# Patient Record
Sex: Female | Born: 2000 | Race: White | Hispanic: No | Marital: Single | Smoking: Never smoker
Health system: Southern US, Community
[De-identification: ages and names within clinical notes are randomized; demographics above are authoritative.]

## PROBLEM LIST (undated history)

## (undated) DIAGNOSIS — J45909 Unspecified asthma, uncomplicated: Secondary | ICD-10-CM

## (undated) DIAGNOSIS — Z91018 Allergy to other foods: Secondary | ICD-10-CM

## (undated) DIAGNOSIS — J309 Allergic rhinitis, unspecified: Secondary | ICD-10-CM

## (undated) HISTORY — PX: WRIST SURGERY: SHX841

## (undated) HISTORY — DX: Unspecified asthma, uncomplicated: J45.909

## (undated) HISTORY — DX: Allergic rhinitis, unspecified: J30.9

## (undated) HISTORY — DX: Allergy to other foods: Z91.018

## (undated) HISTORY — PX: NO PAST SURGERIES: SHX2092

## (undated) HISTORY — PX: WISDOM TOOTH EXTRACTION: SHX21

---
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
## 2020-01-29 IMAGING — CR LEFT FOOT - COMPLETE 3+ VIEW
3 series · 3 of 3 positions shown · non-contrast
Comparison: None.

CLINICAL DATA: Initial evaluation for acute pain status post
softball injury.

EXAM:
LEFT FOOT - COMPLETE 3+ VIEW

[t foot ap left]
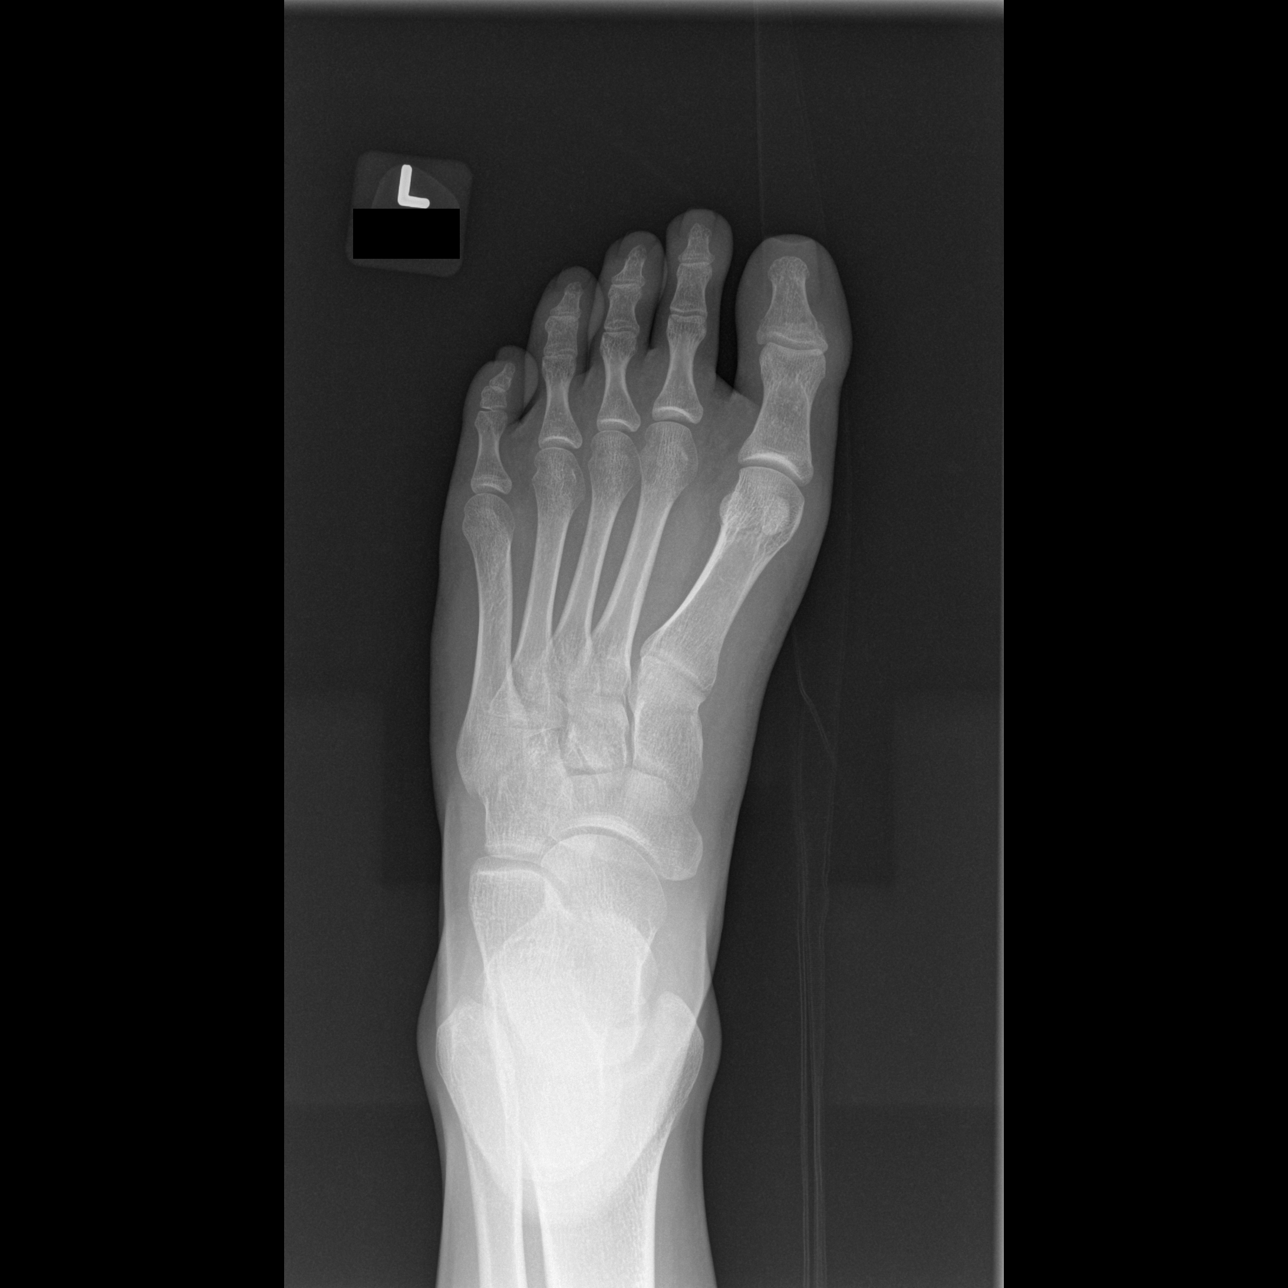

[t foot oblique left]
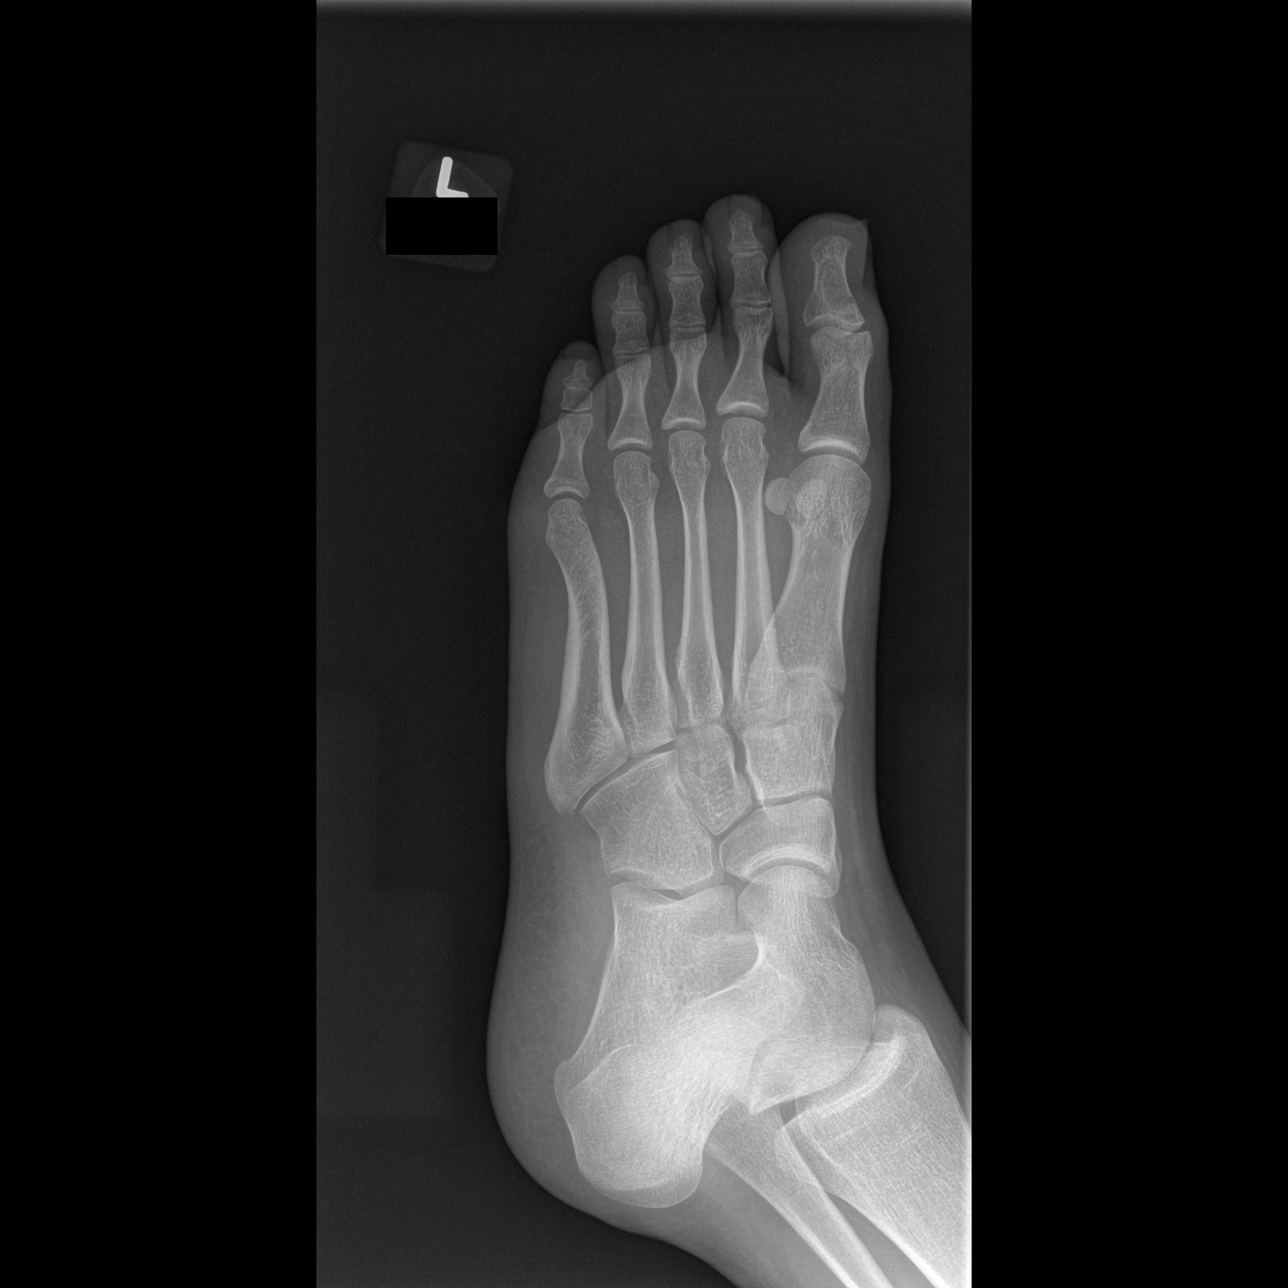

[t foot lat left]
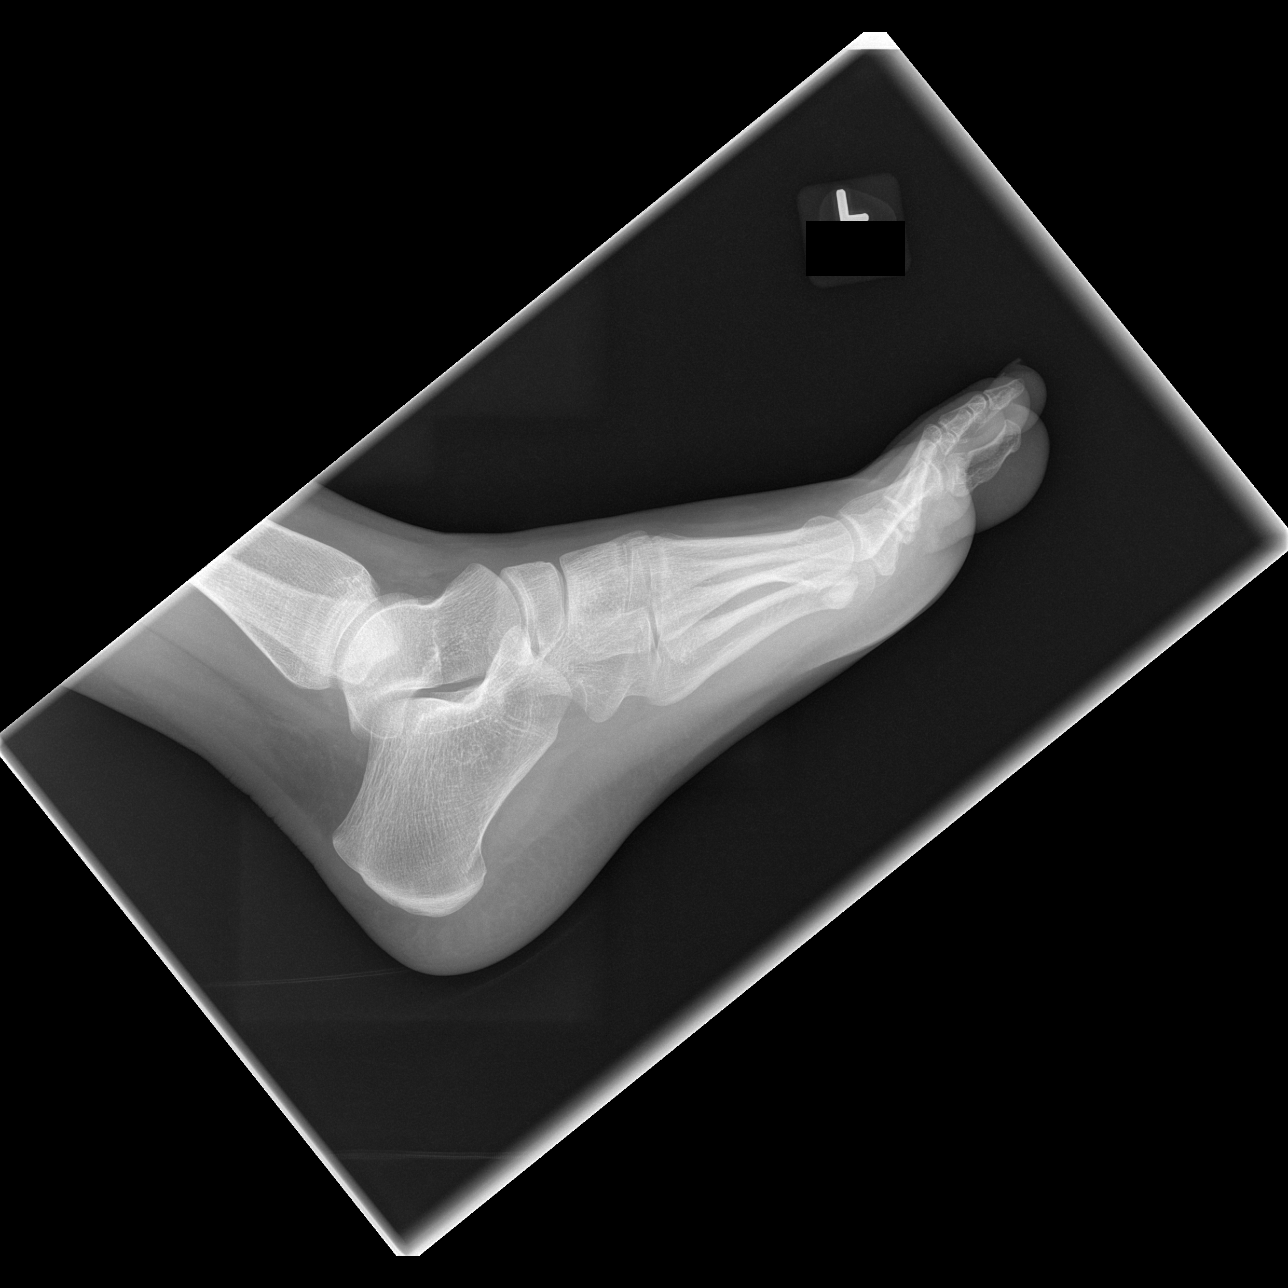

[3 of 3 positions shown; findings below may reference images not displayed]

FINDINGS: There is no evidence of fracture or dislocation. There is no
evidence of arthropathy or other focal bone abnormality. Soft
tissues are unremarkable.
IMPRESSION: No acute osseous abnormality about the left foot.

## 2020-10-17 ENCOUNTER — Ambulatory Visit
Admission: EM | Admit: 2020-10-17 | Discharge: 2020-10-17 | Disposition: A | Discharge status: Home / Self Care | Payer: BC Managed Care – PPO | Attending: Student | Admitting: Student

## 2020-10-17 ENCOUNTER — Other Ambulatory Visit: Payer: Self-pay

## 2020-10-17 DIAGNOSIS — N3001 Acute cystitis with hematuria: Secondary | ICD-10-CM | POA: Insufficient documentation

## 2020-10-17 LAB — POCT URINALYSIS DIP (MANUAL ENTRY)
Bilirubin, UA: NEGATIVE
Glucose, UA: NEGATIVE mg/dL
Ketones, POC UA: NEGATIVE mg/dL
Nitrite, UA: NEGATIVE
Protein Ur, POC: 30 mg/dL — AB
Spec Grav, UA: 1.02 (ref 1.010–1.025)
Urobilinogen, UA: 0.2 E.U./dL
pH, UA: 7 (ref 5.0–8.0)

## 2020-10-17 LAB — POCT URINE PREGNANCY: Preg Test, Ur: NEGATIVE

## 2020-10-17 MED ORDER — NITROFURANTOIN MONOHYD MACRO 100 MG PO CAPS: 100.0000 mg | ORAL_CAPSULE | Freq: Two times a day (BID) | ORAL | 0 refills | Status: DC

## 2020-10-17 NOTE — Discharge Instructions (Addendum)
For your UTI, start the antibiotic- macrobid twice daily for 5 days.   Make sure to drink plenty of water.   If you experience worsening of symptoms despite treatment (abdominal pain, back pain, nausea, fevers, etc)- seek additional, immediate medical attention.

## 2020-10-17 NOTE — ED Provider Notes (Signed)
[name]    CSN: [id number] Arrival date & time: 10/17/20  0843      History   Chief Complaint Chief Complaint  Patient presents with  . Urinary Tract Infection    HPI Molly Pace is a 20 y.o. female presenting with urinary urgency, frequency, suprapubic pressure x2 days. History asthma, allergic rhinitis, UTIs. Denies hematuria, back pain, n/v/d, fevers/chills, abdnormal vaginal discharge. Denies STI risk, declines screening today.   HPI  Past Medical History:  Diagnosis Date  . Allergic rhinitis   . Asthma   . Food allergy     Patient Active Problem List   Diagnosis Date Noted  . Mild intermittent asthma 10/01/2015  . Seasonal allergic rhinitis due to pollen 10/01/2015  . 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 Mild persistent asthma 10/01/2015    Past Surgical History:  Procedure Laterality Date  . NO PAST SURGERIES    . WISDOM TOOTH EXTRACTION    . WRIST SURGERY Right    ganglion cyst removal    OB History   No obstetric history on file.      Home Medications    Prior to Admission medications   Medication Sig Start Date End Date Taking? Authorizing Provider  nitrofurantoin, macrocrystal-monohydrate, (MACROBID) 100 MG capsule Take 1 capsule (100 mg total) by mouth 2 (two) times daily. 10/17/20  Yes Hazel Sams, PA-C  albuterol [facility] HFA) 108 (90 Base) MCG/ACT inhaler Inhale 2 puffs into the lungs every 4 (four) hours as needed for wheezing or shortness of breath. 08/12/18   Charlies Silvers, MD  EPINEPHrine (EPIPEN 2-PAK) 0.3 mg/0.3 mL IJ SOAJ injection USE AS DIRECTED FOR SEVERE ALLERGIC REACTION. HOLD RX.  PT WILL CALL FOR FILL 04/19/18   Charlies Silvers, MD  fexofenadine ([name]) 180 MG tablet Take 180 mg by mouth daily as needed for allergies or rhinitis.    [provider]  fluticasone (FLONASE) 50 MCG/ACT nasal spray Two sprays each nostril once a day for nasal congestion or drainage.  04/19/18   Charlies Silvers, MD  ibuprofen (ADVIL,MOTRIN) 400 MG tablet Take 400 mg by mouth every 6 (six) hours as needed for fever or moderate pain.     [provider]  montelukast (SINGULAIR) 10 MG tablet Take 1 tablet (10 mg total) by mouth at bedtime. 04/19/18   Charlies Silvers, MD  NUVARING 0.12-0.015 MG/24HR vaginal ring INSERT 1 VAGINAL RING MONTHLY FOR 21 DAYS 04/25/17   [provider]    Family History Family History  Problem Relation Age of Onset  . Allergic rhinitis Mother   . Allergic rhinitis Maternal Grandmother   . Breast cancer Maternal Grandmother 29  . Angioedema Neg Hx   . Asthma Neg Hx   . Atopy Neg Hx   . Eczema Neg Hx   . Immunodeficiency Neg Hx   . Urticaria Neg Hx     Social History Social History   Tobacco Use  . Smoking status: Never Smoker  . Smokeless tobacco: Never Used  Vaping Use  . Vaping Use: Never used  Substance Use Topics  . Alcohol use: No    Alcohol/week: 0.0 standard drinks  . Drug use: No     Allergies   Peanut oil, Peanut-containing drug products, and Penicillins   Review of Systems Review of Systems  Constitutional: Negative for appetite change, chills, diaphoresis and fever.  Respiratory: Negative for shortness of breath.   Cardiovascular: Negative for chest pain.  Gastrointestinal: Negative for abdominal pain, blood in stool, constipation, diarrhea, nausea and vomiting.  Genitourinary: Positive for dysuria, frequency and urgency. Negative for decreased urine volume, difficulty urinating, flank pain, genital sores, hematuria, menstrual problem, pelvic pain, vaginal bleeding, vaginal discharge and vaginal pain.  Musculoskeletal: Negative for back pain.  Neurological: Negative for dizziness, weakness and light-headedness.  All other systems reviewed and are negative.    Physical Exam Triage Vital Signs ED Triage Vitals  Enc Vitals Group     BP 10/17/20 0904 114/68     Pulse Rate 10/17/20 0904 74      Resp 10/17/20 0904 20     Temp 10/17/20 0904 98.4 F (36.9 C)     Temp Source 10/17/20 0904 Oral     SpO2 10/17/20 0904 98 %     Weight --      Height --      Head Circumference --      Peak Flow --      Pain Score 10/17/20 0919 2     Pain Loc --      Pain Edu? --      Excl. in [place]? --    No data found.  Updated Vital Signs BP 114/68 (BP Location: Left Arm)   Pulse 74   Temp 98.4 F (36.9 C) (Oral)   Resp 20   LMP 10/01/2020   SpO2 98%   Visual Acuity Right Eye Distance:   Left Eye Distance:   Bilateral Distance:    Right Eye Near:   Left Eye Near:    Bilateral Near:     Physical Exam Vitals reviewed.  Constitutional:      General: She is not in acute distress.    Appearance: Normal appearance. She is not ill-appearing.  HENT:     Head: Normocephalic and atraumatic.  Cardiovascular:     Rate and Rhythm: Normal rate and regular rhythm.     Heart sounds: Normal heart sounds.  Pulmonary:     Effort: Pulmonary effort is normal.     Breath sounds: Normal breath sounds. No wheezing, rhonchi or rales.  Abdominal:     General: Bowel sounds are normal. There is no distension.     Palpations: Abdomen is soft. There is no mass.     Tenderness: There is abdominal tenderness in the suprapubic area. There is no right CVA tenderness, left CVA tenderness, guarding or rebound.     Comments: Bowel sounds positive in all 4 quadrants. No tenderness to palpation. Negative Murphy Sign, Rovsing's sign, McBurney point tenderness.   Neurological:     General: No focal deficit present.     Mental Status: She is alert and oriented to person, place, and time.  Psychiatric:        Mood and Affect: Mood normal.        Behavior: Behavior normal.      UC Treatments / Results  Labs (all labs ordered are listed, but only abnormal results are displayed) Labs Reviewed  POCT URINALYSIS DIP (MANUAL ENTRY) - Abnormal; Notable for the following components:      Result Value   Clarity, UA  cloudy (*)    Blood, UA large (*)    Protein Ur, POC =30 (*)    Leukocytes, UA Large (3+) (*)    All other components within normal limits  URINE CULTURE  POCT URINE PREGNANCY    EKG   Radiology No results found.  Procedures Procedures (including critical care time)  Medications Ordered in  UC Medications - No data to display  Initial Impression / Assessment and Plan / UC Course  I have reviewed the triage vital signs and the nursing notes.  Pertinent labs & imaging results that were available during my care of the patient were reviewed by me and considered in my medical decision making (see chart for details).     This patient is a 20 year old female presenting with urinary symptoms. History of UTI in the past.   UA today with large blood, large leuk. Culture sent. Plan to treat for acute cystitis with Macrobid as below. Rec good hydration.   She denies STI symptoms, declines screening today.   Spent over 30 minutes obtaining H&P, performing physical, discussing results, treatment plan and plan for follow-up with patient. Patient agrees with plan.   This chart was dictated using voice recognition software, Dragon. Despite the best efforts of this provider to proofread and correct errors, errors may still occur which can change documentation meaning.  Final Clinical Impressions(s) / UC Diagnoses   Final diagnoses:  Acute cystitis with hematuria     Discharge Instructions     For your UTI, start the antibiotic- macrobid twice daily for 5 days.   Make sure to drink plenty of water.   If you experience worsening of symptoms despite treatment (abdominal pain, back pain, nausea, fevers, etc)- seek additional, immediate medical attention.   ED Prescriptions    Medication Sig Dispense Auth. Provider   nitrofurantoin, macrocrystal-monohydrate, (MACROBID) 100 MG capsule Take 1 capsule (100 mg total) by mouth 2 (two) times daily. 10 capsule Hazel Sams, PA-C      PDMP not reviewed this encounter.   Hazel Sams, PA-C 10/17/20 [phone]

## 2020-10-17 NOTE — ED Triage Notes (Signed)
Pt c/o burning on urination with urgency and frequency x2 days.

## 2020-10-19 LAB — URINE CULTURE: Culture: >=100000 — AB

## 2020-11-28 ENCOUNTER — Ambulatory Visit: Payer: BC Managed Care – PPO | Admitting: Internal Medicine

## 2020-11-30 ENCOUNTER — Other Ambulatory Visit: Payer: Self-pay

## 2020-11-30 ENCOUNTER — Ambulatory Visit
Admission: EM | Admit: 2020-11-30 | Discharge: 2020-11-30 | Disposition: A | Discharge status: Home / Self Care | Payer: BC Managed Care – PPO | Attending: Urgent Care | Admitting: Urgent Care

## 2020-11-30 DIAGNOSIS — R519 Headache, unspecified: Secondary | ICD-10-CM

## 2020-11-30 MED ORDER — TIZANIDINE HCL 4 MG PO TABS: 4.0000 mg | ORAL_TABLET | Freq: Every day | ORAL | 0 refills | Status: DC
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
## 2020-12-20 ENCOUNTER — Other Ambulatory Visit: Payer: Self-pay | Admitting: General Surgery

## 2020-12-30 HISTORY — PX: BREAST LUMPECTOMY: SHX2

## 2021-01-21 NOTE — Progress Notes (Signed)
Patient did not show for appointment.   

## 2021-01-22 ENCOUNTER — Other Ambulatory Visit: Payer: Self-pay

## 2021-01-22 ENCOUNTER — Encounter: Payer: BC Managed Care – PPO | Admitting: Family

## 2021-01-22 DIAGNOSIS — Z7689 Persons encountering health services in other specified circumstances: Secondary | ICD-10-CM

## 2021-02-03 NOTE — Progress Notes (Signed)
Subjective:    Molly Pace - 20 y.o. female MRN [id number]  Date of birth: [birth year]  HPI  Molly Pace is to establish care. Patient has a PMH significant for mild intermittent asthma, seasonal allergic rhinitis due to pollen, and anaphylactic shock due to adverse food reaction.   Current issues and/or concerns: 1. HIP PAIN: Duration: chronic reports since childhood, has never had a provider to examine Involved hip: bilateral, right worse than left  Mechanism of injury: none Location: diffuse Severity: 4-7/10 Quality: sharp, aching and stabbing Frequency: constant Radiation: no Alleviating factors: nothing  Status: worse Treatments attempted: APAP and ibuprofen   Relief with NSAIDs?: no Paresthesias / decreased sensation: no Swelling: no Redness:no  Comments: Reports her brother has Ehlers-Danlos syndrome.    ROS per HPI    Health Maintenance:  Health Maintenance Due  Topic Date Due  . Pneumococcal Vaccine 49-47 Years old (1 of 4 - PCV13) Never done  . HPV VACCINES (1 - 2-dose series) Never done  . COVID-19 Vaccine (1) Never done  . HIV Screening  Never done  . Hepatitis C Screening  Never done    Past Medical History: Patient Active Problem List   Diagnosis Date Noted  . Ganglion cyst of dorsum of right wrist 01/28/2018  . Mild intermittent asthma 10/01/2015  . Seasonal allergic rhinitis due to pollen 10/01/2015  . 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 Mild persistent asthma 10/01/2015    Social History   reports that she has never smoked. She has never used smokeless tobacco. She reports that she does not drink alcohol and does not use drugs.   Family History  family history includes Allergic rhinitis in her maternal grandmother and mother; Breast cancer (age of onset: 82) in her maternal grandmother; Ehlers-Danlos syndrome in her brother.   Medications: reviewed and updated   Objective:   Physical Exam BP  107/72 (BP Location: Left Arm, Patient Position: Sitting, Cuff Size: Normal)   Pulse 78   Temp 97.7 F (36.5 C)   Resp 15   Ht 5' 6.14" (1.68 m)   Wt 112 lb (50.8 kg)   SpO2 99%   BMI 18.00 kg/m  Physical Exam HENT:     Head: Normocephalic and atraumatic.  Eyes:     Extraocular Movements: Extraocular movements intact.     Conjunctiva/sclera: Conjunctivae normal.     Pupils: Pupils are equal, round, and reactive to light.  Cardiovascular:     Rate and Rhythm: Normal rate and regular rhythm.     Pulses: Normal pulses.     Heart sounds: Normal heart sounds.  Pulmonary:     Effort: Pulmonary effort is normal.     Breath sounds: Normal breath sounds.  Musculoskeletal:     Cervical back: Normal range of motion and neck supple.     Right hip: Bony tenderness present.     Left hip: Bony tenderness and crepitus present. No tenderness.  Neurological:     General: No focal deficit present.     Mental Status: She is alert and oriented to person, place, and time.  Psychiatric:        Mood and Affect: Mood normal.        Behavior: Behavior normal.       Assessment & Plan:  1. Encounter to establish care: - Patient presents today to establish care.  - Return for annual physical examination, labs, and health maintenance. Arrive fasting meaning having no food for at  least 8 hours prior to appointment. You may have only water or black coffee. Please take scheduled medications as normal.  2. Chronic hip pain, bilateral: - Diagnostic x-ray bilateral hips for further evaluation.  - DG Hip Unilat W OR W/O Pelvis 2-3 Views Left; Future - DG Hip Unilat W OR W/O Pelvis 2-3 Views Right; Future    Patient was given clear instructions to go to Emergency Department or return to medical center if symptoms don't improve, worsen, or new problems develop.The patient verbalized understanding.  I discussed the assessment and treatment plan with the patient. The patient was provided an opportunity to  ask questions and all were answered. The patient agreed with the plan and demonstrated an understanding of the instructions.   The patient was advised to call back or seek an in-person evaluation if the symptoms worsen or if the condition fails to improve as anticipated.    Durene Fruits, NP 02/04/2021, 1:08 PM Primary Care at [facility]

## 2021-02-04 ENCOUNTER — Encounter: Payer: Self-pay | Admitting: Family

## 2021-02-04 ENCOUNTER — Ambulatory Visit (INDEPENDENT_AMBULATORY_CARE_PROVIDER_SITE_OTHER): Payer: BC Managed Care – PPO | Admitting: Family

## 2021-02-04 ENCOUNTER — Ambulatory Visit (INDEPENDENT_AMBULATORY_CARE_PROVIDER_SITE_OTHER): Payer: BC Managed Care – PPO

## 2021-02-04 ENCOUNTER — Other Ambulatory Visit: Payer: Self-pay

## 2021-02-04 VITALS — BP 107/72 | HR 78 | Temp 97.7°F | Resp 15 | Ht 66.14 in | Wt 112.0 lb

## 2021-02-04 DIAGNOSIS — Z13228 Encounter for screening for other metabolic disorders: Secondary | ICD-10-CM

## 2021-02-04 DIAGNOSIS — M25552 Pain in left hip: Secondary | ICD-10-CM | POA: Diagnosis not present

## 2021-02-04 DIAGNOSIS — M25551 Pain in right hip: Secondary | ICD-10-CM | POA: Diagnosis not present

## 2021-02-04 DIAGNOSIS — Z130 Encounter for screening for diseases of the blood and blood-forming organs and certain disorders involving the immune mechanism: Secondary | ICD-10-CM

## 2021-02-04 DIAGNOSIS — Z114 Encounter for screening for human immunodeficiency virus [HIV]: Secondary | ICD-10-CM

## 2021-02-04 DIAGNOSIS — G8929 Other chronic pain: Secondary | ICD-10-CM

## 2021-02-04 DIAGNOSIS — Z1329 Encounter for screening for other suspected endocrine disorder: Secondary | ICD-10-CM

## 2021-02-04 DIAGNOSIS — Z1159 Encounter for screening for other viral diseases: Secondary | ICD-10-CM

## 2021-02-04 DIAGNOSIS — Z13220 Encounter for screening for lipoid disorders: Secondary | ICD-10-CM

## 2021-02-04 DIAGNOSIS — Z7689 Persons encountering health services in other specified circumstances: Secondary | ICD-10-CM

## 2021-02-04 DIAGNOSIS — Z131 Encounter for screening for diabetes mellitus: Secondary | ICD-10-CM

## 2021-02-04 NOTE — Progress Notes (Signed)
Establish care Bilateral hip pain and popping when walking more pain in right hip

## 2021-02-04 NOTE — Patient Instructions (Signed)
- Return for annual physical examination, labs, and health maintenance. Arrive fasting meaning having no food for at least 8 hours prior to appointment. You may have only water or black coffee. Please take scheduled medications as normal. Thank you for choosing Primary Care at [facility] for your medical home!    Molly Pace was seen by Camillia Herter, NP today.   Molly Pace's primary care provider is Durene Fruits, NP.   For the best care possible,  you should try to see Durene Fruits, NP whenever you come to clinic.   We look forward to seeing you again soon!  If you have any questions about your visit today,  please call us at [phone]  Or feel free to reach your provider via [place].    Keeping you healthy   Get these tests  Blood pressure- Have your blood pressure checked once a year by your healthcare provider.  Normal blood pressure is 120/80.  Weight- Have your body mass index (BMI) calculated to screen for obesity.  BMI is a measure of body fat based on height and weight. You can also calculate your own BMI at [employer].it.  Cholesterol- Have your cholesterol checked regularly starting at age 52, sooner may be necessary if you have diabetes, high blood pressure, if a family member developed heart diseases at an early age or if you smoke.   Chlamydia, HIV, and other sexual transmitted disease- Get screened each year until the age of 65 then within three months of each new sexual partner.  Diabetes- Have your blood sugar checked regularly if you have high blood pressure, high cholesterol, a family history of diabetes or if you are overweight.   Get these vaccines  Flu shot- Every fall.  Tetanus shot- Every 10 years.  Menactra- Single dose; prevents meningitis.   Take these steps  Don't smoke- If you do smoke, ask your healthcare provider about quitting. For tips on how to quit, go to www.smokefree.gov or call  1-800-QUIT-NOW.  Be physically active- Exercise 5 days a week for at least 30 minutes.  If you are not already physically active start slow and gradually work up to 30 minutes of moderate physical activity.  Examples of moderate activity include walking briskly, mowing the yard, dancing, swimming bicycling, etc.  Eat a healthy diet- Eat a variety of healthy foods such as fruits, vegetables, low fat milk, low fat cheese, yogurt, lean meats, poultry, fish, beans, tofu, etc.  For more information on healthy eating, go to www.thenutritionsource.org  Drink alcohol in moderation- Limit alcohol intake two drinks or less a day.  Never drink and drive.  Dentist- Brush and floss teeth twice daily; visit your dentis twice a year.  Depression-Your emotional health is as important as your physical health.  If you're feeling down, losing interest in things you normally enjoy please talk with your healthcare provider.  Gun Safety- If you keep a gun in your home, keep it unloaded and with the safety lock on.  Bullets should be stored separately.  Helmet use- Always wear a helmet when riding a motorcycle, bicycle, rollerblading or skateboarding.  Safe sex- If you may be exposed to a sexually transmitted infection, use a condom  Seat belts- Seat bels can save your life; always wear one.  Smoke/Carbon Monoxide detectors- These detectors need to be installed on the appropriate level of your home.  Replace batteries at least once a year.  Skin Cancer- When out in the sun, cover up and  use sunscreen SPF 15 or higher.  Violence- If anyone is threatening or hurting you, please tell your healthcare provider.

## 2021-02-05 ENCOUNTER — Other Ambulatory Visit: Payer: Self-pay | Admitting: Family

## 2021-02-05 DIAGNOSIS — G8929 Other chronic pain: Secondary | ICD-10-CM

## 2021-02-05 NOTE — Progress Notes (Signed)
Bilateral hip x-rays normal. Referral to Physical Therapy for chronic pain management. Their office should call patient within 2 weeks with appointment details.

## 2021-02-18 ENCOUNTER — Ambulatory Visit: Payer: BC Managed Care – PPO | Attending: Family | Admitting: Rehabilitative and Restorative Service Providers"

## 2021-04-23 NOTE — Progress Notes (Signed)
New Patient Note  RE: Molly Pace MRN: [id number] DOB: 02-21-2001 Date of Office Visit: 04/24/2021  Consult requested by: Camillia Herter, NP Primary care provider: Camillia Herter, NP  Chief Complaint: Allergy Testing  History of Present Illness: I had the pleasure of seeing Molly Pace for initial evaluation at the [facility] on 04/24/2021. She is a 20 y.o. female, who is self-referred here for the re-evaluation of food allergies, asthma and allergic rhinitis.  Patient was last seen by Dr. Shaune Leeks on 04/19/2018 for asthma, food allergies and allergic rhinitis.  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  Peanuts: The reaction occurred at the age of 5, after she ate touched peanuts and then touched her eye which then swelled up. Patient is not sure of specific events - she may have gone to the ER. This was the first peanut exposure.  Patient had an accidental ingestion to peanuts with some Doritos about 1 month ago. Within minutes she had some itchy ears/throat, abdominal pain, vomiting. She took benadryl and within 10 minutes she felt much better.   She does have access to epinephrine autoinjector and not needed to use it.   Past work up includes: 2017 skin testing was positive to peanut, cashew, pecan, walnut and almond. Patient tolerates almonds and hazelnuts.  Dietary History: patient has been eating other foods including milk, eggs, treenuts - okay with almonds and hazelnuts, sesame, shellfish, fish, soy, wheat, meats, fruits and vegetables.  Asthma: She reports symptoms of chest tightness, shortness of breath, wheezing for 8 years. Current medications include albuterol prn which help. She reports not using aerochamber with inhalers. She tried the following inhalers: none. Main triggers are exercise. In the last month, frequency of symptoms: <1x/week. Frequency of nocturnal symptoms: 0x/month. Frequency of SABA use: <1x/week.  Interference with physical activity: yes. Sleep is undisturbed. In the last 12 months, emergency room visits/urgent care visits/doctor office visits or hospitalizations due to respiratory issues: no. In the last 12 months, oral steroids courses: no. Lifetime history of hospitalization for respiratory issues: no. Prior intubations: no. History of pneumonia: no. She was evaluated by allergist in the past. Smoking exposure: no. Up to date with flu vaccine: yes. Up to date with COVID-19 vaccine: yes. Prior Covid-19 infection: no. History of reflux: no.  Rhinitis: She reports symptoms of itchy/watery eyes, rhinorrhea, sneezing, nasal congestion, PND. Symptoms have been going on for 15+ years. The symptoms are present mainly in the spring and some in the summer. Other triggers include exposure to cats. Anosmia: no. Headache: no. She has used allegra, zyrtec, Flonase with fair improvement in symptoms. Sinus infections: 4 times per year which require antibiotics. Previous work up includes: 2007 skin testing positive to trees, dust mites, cat. No prior AIT. Previous ENT evaluation: no. Previous sinus imaging: no. History of nasal polyps: no. Last eye exam: last year.  Assessment and Plan: Molly Pace is a 20 y.o. female with: Anaphylactic reaction due to other food products, subsequent encounter Periorbital swelling at the age of 5 after peanut contact.  More recently had peanut ingestion which caused itchy ears and throat with abdominal pain and vomiting.  Took Benadryl and symptoms resolved within 10 minutes.  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  Currently tolerates almonds and hazelnuts with no issues. Today's skin testing showed: Positive to peanuts. Borderline to almond and hazelnuts. Start strict avoidance of peanuts and tree nuts. Okay to eat  almonds and hazelnuts as before. Be careful about cross-contamination. Get bloodwork for tree nuts and if negative will  schedule for mixed tree nut butter food challenge.  For mild symptoms you can take over the counter antihistamines such as Benadryl and monitor symptoms closely. If symptoms worsen or if you have severe symptoms including breathing issues, throat closure, significant swelling, whole body hives, severe diarrhea and vomiting, lightheadedness then inject epinephrine and seek immediate medical care afterwards. Emergency action plan given.  Other allergic rhinitis Rhinoconjunctivitis symptoms mainly the spring, summer and exposure to cats.  Tried [name], Zyrtec and Flonase with good benefit.  2007 skin testing was positive to trees, dust mites and cat.  No prior AIT.  4 sinus infections per year. Today's skin prick testing showed: Positive to grass, weed pollen, trees, dust mites, cat, mouse.  Did not perform intradermal testing as patient not interested in AIT. Start environmental control measures as below. Use over the counter antihistamines such as Zyrtec (cetirizine), Claritin (loratadine), [name] (fexofenadine), or Xyzal (levocetirizine) daily as needed. May take twice a day during allergy flares. May switch antihistamines every few months. Use Flonase (fluticasone) nasal spray 1 spray per nostril twice a day as needed for nasal congestion.  Nasal saline spray (i.e., Simply Saline) or nasal saline lavage (i.e., NeilMed) is recommended as needed and prior to medicated nasal sprays.  Allergic conjunctivitis of both eyes See assessment and plan as above. Declines eyedrops.  Mild intermittent asthma Diagnosed with asthma 8 years ago and main triggers are exertion.  Using albuterol less than once per week.  No recent oral steroids. Today's spirometry was normal. May use albuterol rescue inhaler 2 puffs every 4 to 6 hours as needed for shortness of breath, chest tightness, coughing, and wheezing. May use albuterol rescue inhaler 2 puffs 5 to 15 minutes prior to strenuous physical activities. Monitor  frequency of use.   Frequent sinus infections 4 sinus infections per year and has penicillin allergy. Discussed with patient that sinus infections most likely due to her environmental allergies.  Keep track of infections and antibiotics use. Get bloodwork to look at basic immune system.  Penicillin allergy Broke out in hives as a young child. Consider penicillin allergy skin testing and in office drug challenge in the future - given her frequent sinus infections.  Over 90% of people with history of penicillin allergy which occurred over 10 years ago are found to be non-allergic.   Return in about 1 year (around 04/24/2022).  Meds ordered this encounter  Medications   fluticasone (FLONASE) 50 MCG/ACT nasal spray    Sig: Place 1 spray into both nostrils 2 (two) times daily as needed (nasal congestion).    Dispense:  16 g    Refill:  5    Lab Orders         IgE Nut Prof. w/Component Rflx         CBC with Differential/Platelet         IgG, IgA, IgM         Strep pneumoniae 23 Serotypes IgG         Complement, total         Diphtheria / Tetanus Antibody Panel      Other allergy screening: Medication allergy: yes Penicillin - hives Hymenoptera allergy: no Urticaria: no Eczema:no History of recurrent infections suggestive of immunodeficency: no  Diagnostics: Spirometry:  Tracings reviewed. Her effort: Good reproducible efforts. FVC: 3.67L FEV1: 3.42L, 96% predicted FEV1/FVC ratio: 93% Interpretation: Spirometry consistent with  normal pattern.  Please see scanned spirometry results for details.  Skin Testing: Environmental allergy panel and select foods. Positive to grass, weed pollen, trees, dust mites, cat, mouse.  Positive to peanuts. Borderline to almond and hazelnuts. Results discussed with patient/family.  Airborne Adult Perc - 04/24/21 1050     Time Antigen Placed 1050    Allergen Manufacturer [place]    Location Back    Number of Test 59    Panel 1 Select     1. Control-Buffer 50% Glycerol Negative    2. Control-Histamine 1 mg/ml 2+    3. Albumin saline Negative    4. [place] 2+    5. [place] 2+    6. Johnson Negative    7. Morrison Blue Negative    8. Meadow Fescue Negative    9. Perennial Rye Negative    10. Sweet Vernal Negative    11. Timothy Negative    12. Cocklebur Negative    13. Burweed Marshelder Negative    14. Ragweed, short Negative    15. Ragweed, Giant Negative    16. Plantain,  English Negative    17. Lamb's Quarters Negative    18. Sheep Sorrell 2+    19. Rough Pigweed Negative    20. Marsh Elder, Rough Negative    21. Mugwort, Common Negative    22. Ash mix Negative    23. Birch mix 4+    24. Beech American 3+    25. Box, Elder 2+    26. Cedar, red 2+    27. Cottonwood, Eastern 2+    28. Elm mix 2+    29. Hickory 3+    30. Maple mix 2+    31. Oak, [place] mix 4+    32. Pecan Pollen 2+    33. Pine mix Negative    34. Sycamore Eastern Negative    35. [place], Black Pollen 2+    36. Alternaria alternata Negative    37. Cladosporium Herbarum Negative    38. Aspergillus mix Negative    39. Penicillium mix Negative    40. Bipolaris sorokiniana (Helminthosporium) Negative    41. Drechslera spicifera (Curvularia) Negative    42. Mucor plumbeus Negative    43. Fusarium moniliforme Negative    44. Aureobasidium pullulans (pullulara) Negative    45. Rhizopus oryzae Negative    46. Botrytis cinera Negative    47. Epicoccum nigrum Negative    48. Phoma betae Negative    49. Candida Albicans Negative    50. Trichophyton mentagrophytes Negative    51. Mite, D Farinae  5,000 AU/ml Negative    52. Mite, D Pteronyssinus  5,000 AU/ml 3+    53. Cat Hair 10,000 BAU/ml 2+    54.  Dog Epithelia Negative    55. Mixed Feathers Negative    56. Horse Epithelia Negative    57. Cockroach, German Negative    58. Mouse 3+    59. Tobacco Leaf Negative             Food Adult Perc - 04/24/21 1000     Time Antigen Placed  [place]    Location Back    Number of allergen test 9    1. Peanut --   5x5 wheal   10. Cashew Negative    11. Pecan Food Negative    12. [place] Negative    13. Almond --   +/-   14. Hazelnut --   3x3  wheal   15. [place] nut Negative    16. Coconut Negative    17. Pistachio Negative             Past Medical History: Patient Active Problem List   Diagnosis Date Noted   Other allergic rhinitis 04/24/2021   Allergic conjunctivitis of both eyes 04/24/2021   Penicillin allergy 04/24/2021   Frequent sinus infections 04/24/2021   Ganglion cyst of dorsum of right wrist 01/28/2018   Mild intermittent asthma 10/01/2015   Anaphylactic reaction due to other food products, subsequent encounter 10/01/2015   Past Medical History:  Diagnosis Date   Allergic rhinitis    Asthma    Food allergy    Past Surgical History: Past Surgical History:  Procedure Laterality Date   BREAST LUMPECTOMY Right 12/2020   NO PAST SURGERIES     WISDOM TOOTH EXTRACTION     WRIST SURGERY Right 04/2019   ganglion cyst removal   Medication List:  Current Outpatient Medications  Medication Sig Dispense Refill   albuterol (PROAIR HFA) 108 (90 Base) MCG/ACT inhaler Inhale 2 puffs into the lungs every 4 (four) hours as needed for wheezing or shortness of breath. 2 Inhaler 2   EPINEPHrine (EPIPEN 2-PAK) 0.3 mg/0.3 mL IJ SOAJ injection USE AS DIRECTED FOR SEVERE ALLERGIC REACTION. HOLD RX.  PT WILL CALL FOR FILL 4 Device 2   fexofenadine ([name]) 180 MG tablet Take 180 mg by mouth daily as needed for allergies or rhinitis.     fluticasone (FLONASE) 50 MCG/ACT nasal spray Place 1 spray into both nostrils 2 (two) times daily as needed (nasal congestion). 16 g 5   ibuprofen (ADVIL,MOTRIN) 400 MG tablet Take 400 mg by mouth every 6 (six) hours as needed for fever or moderate pain.      NUVARING 0.12-0.015 MG/24HR vaginal ring INSERT 1 VAGINAL RING MONTHLY FOR 21 DAYS  4   No  current facility-administered medications for this visit.   Allergies: Allergies  Allergen Reactions   Peanut Oil Anaphylaxis   Peanut-Containing Drug Products Anaphylaxis    All nuts   Penicillins Hives   Penicillamine Rash   Social History: Social History   Socioeconomic History   Marital status: Single    Spouse name: Not on file   Number of children: Not on file   Years of education: Not on file   Highest education level: Not on file  Occupational History   Not on file  Tobacco Use   Smoking status: Never   Smokeless tobacco: Never  Vaping Use   Vaping Use: Never used  Substance and Sexual Activity   Alcohol use: No    Alcohol/week: 0.0 standard drinks   Drug use: No   Sexual activity: Not on file    Comment: Nuva-ring   Other Topics Concern   Not on file  Social History Narrative   Not on file   Social Determinants of Health   Financial Resource Strain: Not on file  Food Insecurity: Not on file  Transportation Needs: Not on file  Physical Activity: Not on file  Stress: Not on file  Social Connections: Not on file   Lives in a house. Smoking: denies Occupation: Patent examiner HistoryFreight forwarder in the house: no Charity fundraiser in the family room: yes Carpet in the bedroom: yes Heating: electric Cooling: central Pet: yes 1 dog x 15 yrs, 1 cat x 2 yrs  Family History: Family History  Problem Relation Age of Onset   Allergic rhinitis Mother  Allergic rhinitis Maternal Grandmother    Breast cancer Maternal Grandmother 48   Ehlers-Danlos syndrome Brother    Angioedema Neg Hx    Asthma Neg Hx    Atopy Neg Hx    Eczema Neg Hx    Immunodeficiency Neg Hx    Urticaria Neg Hx    Review of Systems  Constitutional:  Negative for appetite change, chills, fever and unexpected weight change.  HENT:  Negative for congestion and rhinorrhea.   Eyes:  Negative for itching.  Respiratory:  Negative for cough, chest tightness, shortness of  breath and wheezing.   Cardiovascular:  Negative for chest pain.  Gastrointestinal:  Negative for abdominal pain.  Genitourinary:  Negative for difficulty urinating.  Skin:  Negative for rash.  Allergic/Immunologic: Positive for environmental allergies and food allergies.  Neurological:  Negative for headaches.   Objective: BP (!) 100/52   Pulse 79   Temp 97.8 F (36.6 C) (Tympanic)   Resp 12   Ht 5' 5.63" (1.667 m)   Wt 110 lb 9.6 oz (50.2 kg)   SpO2 98%   BMI 18.05 kg/m  Body mass index is 18.05 kg/m. Physical Exam Vitals and nursing note reviewed.  Constitutional:      Appearance: Normal appearance. She is well-developed.  HENT:     Head: Normocephalic and atraumatic.     Right Ear: Tympanic membrane and external ear normal.     Left Ear: Tympanic membrane and external ear normal.     Nose: Nose normal.     Mouth/Throat:     Mouth: Mucous membranes are moist.     Pharynx: Oropharynx is clear.  Eyes:     Conjunctiva/sclera: Conjunctivae normal.  Cardiovascular:     Rate and Rhythm: Normal rate and regular rhythm.     Heart sounds: Normal heart sounds. No murmur heard.   No friction rub. No gallop.  Pulmonary:     Effort: Pulmonary effort is normal.     Breath sounds: Normal breath sounds. No wheezing, rhonchi or rales.  Musculoskeletal:     Cervical back: Neck supple.  Skin:    General: Skin is warm.     Findings: No rash.  Neurological:     Mental Status: She is alert and oriented to person, place, and time.  Psychiatric:        Behavior: Behavior normal.   The plan was reviewed with the patient/family, and all questions/concerned were addressed.  It was my pleasure to see Molly Pace today and participate in her care. Please feel free to contact me with any questions or concerns.  Sincerely,  Rexene Alberts, DO Allergy & Immunology  [facility] office: [place] office: [phone]

## 2021-04-24 ENCOUNTER — Ambulatory Visit: Payer: BC Managed Care – PPO | Admitting: Allergy

## 2021-04-24 ENCOUNTER — Other Ambulatory Visit: Payer: Self-pay

## 2021-04-24 ENCOUNTER — Ambulatory Visit: Payer: Self-pay | Admitting: Allergy

## 2021-04-24 ENCOUNTER — Encounter: Payer: Self-pay | Admitting: Allergy

## 2021-04-24 VITALS — BP 100/52 | HR 79 | Temp 97.8°F | Resp 12 | Ht 65.63 in | Wt 110.6 lb

## 2021-04-24 DIAGNOSIS — J4520 Mild intermittent asthma, uncomplicated: Secondary | ICD-10-CM | POA: Diagnosis not present

## 2021-04-24 DIAGNOSIS — J3089 Other allergic rhinitis: Secondary | ICD-10-CM | POA: Diagnosis not present

## 2021-04-24 DIAGNOSIS — H1013 Acute atopic conjunctivitis, bilateral: Secondary | ICD-10-CM | POA: Insufficient documentation

## 2021-04-24 DIAGNOSIS — J329 Chronic sinusitis, unspecified: Secondary | ICD-10-CM | POA: Insufficient documentation

## 2021-04-24 DIAGNOSIS — Z880 Allergy status to penicillin: Secondary | ICD-10-CM

## 2021-04-24 DIAGNOSIS — T7809XD Anaphylactic reaction due to other food products, subsequent encounter: Secondary | ICD-10-CM

## 2021-04-24 MED ORDER — FLUTICASONE PROPIONATE 50 MCG/ACT NA SUSP: 1.0000 | Freq: Two times a day (BID) | NASAL | 5 refills | Status: DC | PRN

## 2021-04-24 NOTE — Assessment & Plan Note (Signed)
Diagnosed with asthma 8 years ago and main triggers are exertion.  Using albuterol less than once per week.  No recent oral steroids.  Today's spirometry was normal. . May use albuterol rescue inhaler 2 puffs every 4 to 6 hours as needed for shortness of breath, chest tightness, coughing, and wheezing. May use albuterol rescue inhaler 2 puffs 5 to 15 minutes prior to strenuous physical activities. Monitor frequency of use.

## 2021-04-24 NOTE — Assessment & Plan Note (Signed)
Broke out in hives as a young child.  Consider penicillin allergy skin testing and in office drug challenge in the future - given her frequent sinus infections.   Over 90% of people with history of penicillin allergy which occurred over 10 years ago are found to be non-allergic.

## 2021-04-24 NOTE — Assessment & Plan Note (Signed)
4 sinus infections per year and has penicillin allergy.  Discussed with patient that sinus infections most likely due to her environmental allergies.  Marland Kitchen Keep track of infections and antibiotics use. . Get bloodwork to look at basic immune system.

## 2021-04-24 NOTE — Patient Instructions (Addendum)
Today's skin testing showed: Positive to grass, weed pollen, trees, dust mites, cat, mouse.  Positive to peanuts. Borderline to almond and hazelnuts. Results given.   Food allergies Start strict avoidance of peanuts and tree nuts. Okay to eat almonds and hazelnuts as before. Be careful about cross-contamination. Get bloodwork for tree nuts and if negative will schedule for food challenge.  We are ordering labs, so please allow 1-2 weeks for the results to come back. With the newly implemented Cures Act, the labs might be visible to you at the same time that they become visible to me. However, I will not address the results until all of the results are back, so please be patient.  In the meantime, continue recommendations in your patient instructions, including avoidance measures (if applicable), until you hear from me. For mild symptoms you can take over the counter antihistamines such as Benadryl and monitor symptoms closely. If symptoms worsen or if you have severe symptoms including breathing issues, throat closure, significant swelling, whole body hives, severe diarrhea and vomiting, lightheadedness then inject epinephrine and seek immediate medical care afterwards. Emergency action plan given.  Environmental allergies Start environmental control measures as below. Use over the counter antihistamines such as Zyrtec (cetirizine), Claritin (loratadine), [name] (fexofenadine), or Xyzal (levocetirizine) daily as needed. May take twice a day during allergy flares. May switch antihistamines every few months. Use Flonase (fluticasone) nasal spray 1 spray per nostril twice a day as needed for nasal congestion.  Nasal saline spray (i.e., Simply Saline) or nasal saline lavage (i.e., NeilMed) is recommended as needed and prior to medicated nasal sprays.  Asthma:  Normal breathing test today.  May use albuterol rescue inhaler 2 puffs every 4 to 6 hours as needed for shortness of breath, chest  tightness, coughing, and wheezing. May use albuterol rescue inhaler 2 puffs 5 to 15 minutes prior to strenuous physical activities. Monitor frequency of use.  Asthma control goals:  Full participation in all desired activities (may need albuterol before activity) Albuterol use two times or less a week on average (not counting use with activity) Cough interfering with sleep two times or less a month Oral steroids no more than once a year No hospitalizations   Penicillin allergy: Consider penicillin allergy skin testing and in office drug challenge in the future.  Over 90% of people with history of penicillin allergy which occurred over 10 years ago are found to be non-allergic.  You must be off antihistamines for 3-5 days before. Plan on being in the office for 2-3 hours. You must call to schedule an appointment and specify it's for a drug challenge.  A few days prior to the appointment, I will send in a prescription for amoxicillin liquid which you must bring to the appointment as well.   Sinus infections: Keep track of infections and antibiotics use. Get bloodwork to look at immune system.  Follow up in 12 months or sooner if needed.    Reducing Pollen Exposure Pollen seasons: trees (spring), grass (summer) and ragweed/weeds (fall). Keep windows closed in your home and car to lower pollen exposure.  Install air conditioning in the bedroom and throughout the house if possible.  Avoid going out in dry windy days - especially early morning. Pollen counts are highest between 5 - 10 AM and on dry, hot and windy days.  Save outside activities for late afternoon or after a heavy rain, when pollen levels are lower.  Avoid mowing of grass if you have grass pollen allergy. Be  aware that pollen can also be transported indoors on people and pets.  Dry your clothes in an automatic dryer rather than hanging them outside where they might collect pollen.  Rinse hair and eyes before bedtime. Control  of House Dust Mite Allergen Dust mite allergens are a common trigger of allergy and asthma symptoms. While they can be found throughout the house, these microscopic creatures thrive in warm, humid environments such as bedding, upholstered furniture and carpeting. Because so much time is spent in the bedroom, it is essential to reduce mite levels there.  Encase pillows, mattresses, and box springs in special allergen-proof fabric covers or airtight, zippered plastic covers.  Bedding should be washed weekly in hot water (130 F) and dried in a hot dryer. Allergen-proof covers are available for comforters and pillows that can't be regularly washed.  Wash the allergy-proof covers every few months. Minimize clutter in the bedroom. Keep pets out of the bedroom.  Keep humidity less than 50% by using a dehumidifier or air conditioning. You can buy a humidity measuring device called a hygrometer to monitor this.  If possible, replace carpets with hardwood, linoleum, or washable area rugs. If that's not possible, vacuum frequently with a vacuum that has a HEPA filter. Remove all upholstered furniture and non-washable window drapes from the bedroom. Remove all non-washable stuffed toys from the bedroom.  Wash stuffed toys weekly. Pet Allergen Avoidance: Contrary to popular opinion, there are no "hypoallergenic" breeds of dogs or cats. That is because people are not allergic to an animal's hair, but to an allergen found in the animal's saliva, dander (dead skin flakes) or urine. Pet allergy symptoms typically occur within minutes. For some people, symptoms can build up and become most severe 8 to 12 hours after contact with the animal. People with severe allergies can experience reactions in public places if dander has been transported on the pet owners' clothing. Keeping an animal outdoors is only a partial solution, since homes with pets in the yard still have higher concentrations of animal allergens. Before  getting a pet, ask your allergist to determine if you are allergic to animals. If your pet is already considered part of your family, try to minimize contact and keep the pet out of the bedroom and other rooms where you spend a great deal of time. As with dust mites, vacuum carpets often or replace carpet with a hardwood floor, tile or linoleum. High-efficiency particulate air (HEPA) cleaners can reduce allergen levels over time. While dander and saliva are the source of cat and dog allergens, urine is the source of allergens from rabbits, hamsters, mice and [place] pigs; so ask a non-allergic family member to clean the animal's cage. If you have a pet allergy, talk to your allergist about the potential for allergy immunotherapy (allergy shots). This strategy can often provide long-term relief.

## 2021-04-24 NOTE — Assessment & Plan Note (Signed)
.   See assessment and plan as above. . Declines eyedrops.

## 2021-04-24 NOTE — Assessment & Plan Note (Signed)
Rhinoconjunctivitis symptoms mainly the spring, summer and exposure to cats.  Tried [name], Zyrtec and Flonase with good benefit.  2007 skin testing was positive to trees, dust mites and cat.  No prior AIT.  4 sinus infections per year.  Today's skin prick testing showed: Positive to grass, weed pollen, trees, dust mites, cat, mouse.   Did not perform intradermal testing as patient not interested in AIT.  Start environmental control measures as below.  Use over the counter antihistamines such as Zyrtec (cetirizine), Claritin (loratadine), [name] (fexofenadine), or Xyzal (levocetirizine) daily as needed. May take twice a day during allergy flares. May switch antihistamines every few months. . Use Flonase (fluticasone) nasal spray 1 spray per nostril twice a day as needed for nasal congestion.   Nasal saline spray (i.e., Simply Saline) or nasal saline lavage (i.e., NeilMed) is recommended as needed and prior to medicated nasal sprays.

## 2021-04-24 NOTE — Assessment & Plan Note (Signed)
Periorbital swelling at the age of 5 after peanut contact.  More recently had peanut ingestion which caused itchy ears and throat with abdominal pain and vomiting.  Took Benadryl and symptoms resolved within 10 minutes.  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  Currently tolerates almonds and hazelnuts with no issues.  Today's skin testing showed: Positive to peanuts. Borderline to almond and hazelnuts.  Start strict avoidance of peanuts and tree nuts.  Okay to eat almonds and hazelnuts as before. Be careful about cross-contamination. . Get bloodwork for tree nuts and if negative will schedule for mixed tree nut butter food challenge.   For mild symptoms you can take over the counter antihistamines such as Benadryl and monitor symptoms closely. If symptoms worsen or if you have severe symptoms including breathing issues, throat closure, significant swelling, whole body hives, severe diarrhea and vomiting, lightheadedness then inject epinephrine and seek immediate medical care afterwards.  Emergency action plan given.

## 2021-05-14 ENCOUNTER — Other Ambulatory Visit: Payer: Self-pay

## 2021-05-15 ENCOUNTER — Other Ambulatory Visit: Payer: Self-pay

## 2021-05-15 ENCOUNTER — Encounter: Payer: Self-pay | Admitting: Nurse Practitioner

## 2021-05-15 ENCOUNTER — Ambulatory Visit (INDEPENDENT_AMBULATORY_CARE_PROVIDER_SITE_OTHER): Payer: BC Managed Care – PPO | Admitting: Nurse Practitioner

## 2021-05-15 VITALS — BP 125/78 | HR 85 | Temp 97.9°F | Resp 18 | Ht 66.14 in | Wt 109.4 lb

## 2021-05-15 DIAGNOSIS — R5383 Other fatigue: Secondary | ICD-10-CM | POA: Diagnosis not present

## 2021-05-15 DIAGNOSIS — R110 Nausea: Secondary | ICD-10-CM | POA: Diagnosis not present

## 2021-05-15 NOTE — Progress Notes (Signed)
$'@Patient'F$  ID: Molly Pace, female    DOB: [birth year], 20 y.o.   MRN: [id number]  Chief Complaint  Patient presents with   Fatigue    Referring provider: Camillia Herter, NP   HPI   Patient presents today with fatigue and decreased appetite. Patient has lost around 10 pounds. She states that these issues have been going on for the past few weeks. Patient also states that she gets nauseated when she eats at times. She denies any possibility of pregnancy. Patient is currently working full time and is in college. She denies any significant recent life changes. Denies f/c/s, n/v/d, hemoptysis, PND, chest pain or edema.      Allergies  Allergen Reactions   Peanut Oil Anaphylaxis   Peanut-Containing Drug Products Anaphylaxis    All nuts   Penicillins Hives   Penicillamine Rash    Immunization History  Administered Date(s) Administered   Influenza-Unspecified 09/04/2020   Tdap 12/26/2017    Past Medical History:  Diagnosis Date   Allergic rhinitis    Asthma    Food allergy     Tobacco History: Social History   Tobacco Use  Smoking Status Never  Smokeless Tobacco Never   Counseling given: Yes   Outpatient Encounter Medications as of 05/15/2021  Medication Sig   albuterol (PROAIR HFA) 108 (90 Base) MCG/ACT inhaler Inhale 2 puffs into the lungs every 4 (four) hours as needed for wheezing or shortness of breath.   EPINEPHrine (EPIPEN 2-PAK) 0.3 mg/0.3 mL IJ SOAJ injection USE AS DIRECTED FOR SEVERE ALLERGIC REACTION. HOLD RX.  PT WILL CALL FOR FILL   fexofenadine ([name]) 180 MG tablet Take 180 mg by mouth daily as needed for allergies or rhinitis.   fluticasone (FLONASE) 50 MCG/ACT nasal spray Place 1 spray into both nostrils 2 (two) times daily as needed (nasal congestion).   ibuprofen (ADVIL,MOTRIN) 400 MG tablet Take 400 mg by mouth every 6 (six) hours as needed for fever or moderate pain.    NUVARING 0.12-0.015 MG/24HR vaginal ring INSERT 1 VAGINAL RING  MONTHLY FOR 21 DAYS   No facility-administered encounter medications on file as of 05/15/2021.     Review of Systems  Review of Systems  Constitutional:  Positive for appetite change, fatigue and unexpected weight change.  HENT: Negative.    Cardiovascular: Negative.   Gastrointestinal: Negative.   Allergic/Immunologic: Negative.   Neurological: Negative.   Psychiatric/Behavioral: Negative.        Physical Exam  BP 125/78 (BP Location: Left Arm, Patient Position: Sitting, Cuff Size: Small)   Pulse 85   Temp 97.9 F (36.6 C)   Resp 18   Ht 5' 6.14" (1.68 m)   Wt 109 lb 6.4 oz (49.6 kg)   SpO2 98%   BMI 17.58 kg/m   Wt Readings from Last 5 Encounters:  05/15/21 109 lb 6.4 oz (49.6 kg)  04/24/21 110 lb 9.6 oz (50.2 kg)  02/04/21 112 lb (50.8 kg)  11/11/18 119 lb (54 kg) (41 %, Z= -0.24)*  11/09/18 123 lb (55.8 kg) (49 %, Z= -0.02)*   * Growth percentiles are based on CDC (Girls, 2-20 Years) data.     Physical Exam Vitals and nursing note reviewed.  Constitutional:      General: She is not in acute distress.    Appearance: She is well-developed.  Cardiovascular:     Rate and Rhythm: Normal rate and regular rhythm.  Pulmonary:     Effort: Pulmonary effort is normal.  Breath sounds: Normal breath sounds.  Neurological:     Mental Status: She is alert and oriented to person, place, and time.  Psychiatric:        Mood and Affect: Mood normal.        Behavior: Behavior normal.      Assessment & Plan:   Fatigue Decreased appetite:  Eat 6 small meals per day - high protein low carb  Stay active - start walking routine  Will order labs  May start multivitamin   Follow up:  Follow up as scheduled  Patient Instructions  Fatigue Decreased appetite:  Eat 6 small meals per day - high protein low carb  Stay active - start walking routine  Will order labs  May start multivitamin   Follow up:  Follow up as scheduled    Fatigue If you  have fatigue, you feel tired all the time and have a lack of energy or a lack of motivation. Fatigue may make it difficult to start or complete tasks because of exhaustion. In general, occasional or mild fatigue is often a normal response to activity or life. However, long-lasting (chronic) or extreme fatigue may be a symptom of a medical condition. Follow these instructions at home: General instructions Watch your fatigue for any changes. Go to bed and get up at the same time every day. Avoid fatigue by pacing yourself during the day and getting enough sleep at night. Maintain a healthy weight. Medicines Take over-the-counter and prescription medicines only as told by your health care provider. Take a multivitamin, if told by your health care provider.  Do not use herbal or dietary supplements unless they are approved by your health care provider. Activity  Exercise regularly, as told by your health care provider. Use or practice techniques to help you relax, such as yoga, tai chi, meditation, or massage therapy. Eating and drinking  Avoid heavy meals in the evening. Eat a well-balanced diet, which includes lean proteins, whole grains, plenty of fruits and vegetables, and low-fat dairy products. Avoid consuming too much caffeine. Avoid the use of alcohol. Drink enough fluid to keep your urine pale yellow. Lifestyle Change situations that cause you stress. Try to keep your work and personal schedule in balance. Do not use any products that contain nicotine or tobacco, such as cigarettes and e-cigarettes. If you need help quitting, ask your health care provider. Do not use drugs. Contact a health care provider if: Your fatigue does not get better. You have a fever. You suddenly lose or gain weight. You have headaches. You have trouble falling asleep or sleeping through the night. You feel angry, guilty, anxious, or sad. You are unable to have a bowel movement (constipation). Your  skin is dry. You have swelling in your legs or another part of your body. Get help right away if: You feel confused. Your vision is blurry. You feel faint or you pass out. You have a severe headache. You have severe pain in your abdomen, your back, or the area between your waist and hips (pelvis). You have chest pain, shortness of breath, or an irregular or fast heartbeat. You are unable to urinate, or you urinate less than normal. You have abnormal bleeding, such as bleeding from the rectum, vagina, nose, lungs, or nipples. You vomit blood. You have thoughts about hurting yourself or others. If you ever feel like you may hurt yourself or others, or have thoughts about taking your own life, get help right away. You can go to  your nearest emergency department or call: Your local emergency services (911 in the [place] A suicide crisis helpline, such as the [place] at 6702572889. This is open 24 hours a day. Summary If you have fatigue, you feel tired all the time and have a lack of energy or a lack of motivation. Fatigue may make it difficult to start or complete tasks because of exhaustion. Long-lasting (chronic) or extreme fatigue may be a symptom of a medical condition. Exercise regularly, as told by your health care provider. Change situations that cause you stress. Try to keep your work and personal schedule in balance. This information is not intended to replace advice given to you by your health care provider. Make sure you discuss any questions you have with your health care provider. Document Revised: 06/28/2020 Document Reviewed: 06/28/2020 Elsevier Patient Education  2022 [place], [place] 05/15/2021

## 2021-05-15 NOTE — Assessment & Plan Note (Signed)
Decreased appetite:  Eat 6 small meals per day - high protein low carb  Stay active - start walking routine  Will order labs  May start multivitamin   Follow up:  Follow up as scheduled

## 2021-05-15 NOTE — Progress Notes (Signed)
Pt presents for fatigue, pt reports been feeling tired and sleepy more than usual

## 2021-05-15 NOTE — Patient Instructions (Addendum)
Fatigue Decreased appetite:  Eat 6 small meals per day - high protein low carb  Stay active - start walking routine  Will order labs  May start multivitamin   Follow up:  Follow up as scheduled    Fatigue If you have fatigue, you feel tired all the time and have a lack of energy or a lack of motivation. Fatigue may make it difficult to start or complete tasks because of exhaustion. In general, occasional or mild fatigue is often a normal response to activity or life. However, long-lasting (chronic) or extreme fatigue may be a symptom of a medical condition. Follow these instructions at home: General instructions Watch your fatigue for any changes. Go to bed and get up at the same time every day. Avoid fatigue by pacing yourself during the day and getting enough sleep at night. Maintain a healthy weight. Medicines Take over-the-counter and prescription medicines only as told by your health care provider. Take a multivitamin, if told by your health care provider.  Do not use herbal or dietary supplements unless they are approved by your health care provider. Activity  Exercise regularly, as told by your health care provider. Use or practice techniques to help you relax, such as yoga, tai chi, meditation, or massage therapy. Eating and drinking  Avoid heavy meals in the evening. Eat a well-balanced diet, which includes lean proteins, whole grains, plenty of fruits and vegetables, and low-fat dairy products. Avoid consuming too much caffeine. Avoid the use of alcohol. Drink enough fluid to keep your urine pale yellow. Lifestyle Change situations that cause you stress. Try to keep your work and personal schedule in balance. Do not use any products that contain nicotine or tobacco, such as cigarettes and e-cigarettes. If you need help quitting, ask your health care provider. Do not use drugs. Contact a health care provider if: Your fatigue does not get better. You have a  fever. You suddenly lose or gain weight. You have headaches. You have trouble falling asleep or sleeping through the night. You feel angry, guilty, anxious, or sad. You are unable to have a bowel movement (constipation). Your skin is dry. You have swelling in your legs or another part of your body. Get help right away if: You feel confused. Your vision is blurry. You feel faint or you pass out. You have a severe headache. You have severe pain in your abdomen, your back, or the area between your waist and hips (pelvis). You have chest pain, shortness of breath, or an irregular or fast heartbeat. You are unable to urinate, or you urinate less than normal. You have abnormal bleeding, such as bleeding from the rectum, vagina, nose, lungs, or nipples. You vomit blood. You have thoughts about hurting yourself or others. If you ever feel like you may hurt yourself or others, or have thoughts about taking your own life, get help right away. You can go to your nearest emergency department or call: Your local emergency services (911 in the [place] A suicide crisis helpline, such as the [place] at 256 792 9949. This is open 24 hours a day. Summary If you have fatigue, you feel tired all the time and have a lack of energy or a lack of motivation. Fatigue may make it difficult to start or complete tasks because of exhaustion. Long-lasting (chronic) or extreme fatigue may be a symptom of a medical condition. Exercise regularly, as told by your health care provider. Change situations that cause you stress. Try to keep your  work and personal schedule in balance. This information is not intended to replace advice given to you by your health care provider. Make sure you discuss any questions you have with your health care provider. Document Revised: 06/28/2020 Document Reviewed: 06/28/2020 Elsevier Patient Education  2022 [employer].

## 2021-05-16 LAB — THYROID PANEL WITH TSH
Free Thyroxine Index: 1.9 (ref 1.2–4.9)
T3 Uptake Ratio: 26 % (ref 24–39)
T4, Total: 7.2 ug/dL (ref 4.5–12.0)
TSH: 0.588 u[IU]/mL (ref 0.450–4.500)

## 2021-05-16 LAB — CBC
Hematocrit: 40.3 % (ref 34.0–46.6)
Hemoglobin: 14.3 g/dL (ref 11.1–15.9)
MCH: 31.1 pg (ref 26.6–33.0)
MCHC: 35.5 g/dL (ref 31.5–35.7)
MCV: 88 fL (ref 79–97)
Platelets: 292 10*3/uL (ref 150–450)
RBC: 4.6 x10E6/uL (ref 3.77–5.28)
RDW: 11.9 % (ref 11.7–15.4)
WBC: 9.5 10*3/uL (ref 3.4–10.8)

## 2021-05-16 LAB — COMPREHENSIVE METABOLIC PANEL
ALT: 11 IU/L (ref 0–32)
AST: 16 IU/L (ref 0–40)
Albumin/Globulin Ratio: 2.3 — ABNORMAL HIGH (ref 1.2–2.2)
Albumin: 5.1 g/dL — ABNORMAL HIGH (ref 3.9–5.0)
Alkaline Phosphatase: 72 IU/L (ref 42–106)
BUN/Creatinine Ratio: 10 (ref 9–23)
BUN: 8 mg/dL (ref 6–20)
Bilirubin Total: 0.9 mg/dL (ref 0.0–1.2)
CO2: 24 mmol/L (ref 20–29)
Calcium: 9.9 mg/dL (ref 8.7–10.2)
Chloride: 99 mmol/L (ref 96–106)
Creatinine, Ser: 0.79 mg/dL (ref 0.57–1.00)
Globulin, Total: 2.2 g/dL (ref 1.5–4.5)
Glucose: 90 mg/dL (ref 65–99)
Potassium: 3.4 mmol/L — ABNORMAL LOW (ref 3.5–5.2)
Sodium: 140 mmol/L (ref 134–144)
Total Protein: 7.3 g/dL (ref 6.0–8.5)
eGFR: 110 mL/min/{1.73_m2} (ref >59)

## 2021-05-16 LAB — VITAMIN D 25 HYDROXY (VIT D DEFICIENCY, FRACTURES): Vit D, 25-Hydroxy: 22.6 ng/mL — ABNORMAL LOW (ref 30.0–100.0)

## 2022-05-29 ENCOUNTER — Ambulatory Visit
Admission: RE | Admit: 2022-05-29 | Discharge: 2022-05-29 | Disposition: A | Discharge status: Home / Self Care | Payer: BC Managed Care – PPO | Source: Ambulatory Visit | Attending: Internal Medicine | Admitting: Internal Medicine

## 2022-05-29 VITALS — BP 121/72 | HR 74 | Temp 98.0°F | Resp 18

## 2022-05-29 DIAGNOSIS — J0181 Other acute recurrent sinusitis: Secondary | ICD-10-CM | POA: Diagnosis not present

## 2022-05-29 MED ORDER — AZITHROMYCIN 250 MG PO TABS: ORAL_TABLET | ORAL | 0 refills | Status: AC

## 2022-05-29 NOTE — ED Provider Notes (Signed)
[name]    CSN: [id number] Arrival date & time: 05/29/22  1616      History   Chief Complaint Chief Complaint  Patient presents with   Nasal Congestion    Having sinus pressure and nasal drainage into throat - Entered by patient    HPI Molly Pace is a 21 y.o. female.   Patient presents with nasal congestion, scratchy throat, sinus pressure that has been present for about 1 week.  Denies any associated cough, fever, chest pain, shortness of breath, nausea, vomiting, diarrhea, abdominal pain.  Patient reports that she thought symptoms were attributed to allergies so she took [name] with minimal improvement in symptoms.     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      Home Medications    Prior to Admission medications   Medication Sig Start Date End Date Taking? Authorizing Provider  azithromycin (ZITHROMAX Z-PAK) 250 MG tablet Take 2 tablets (500 mg total) by mouth daily for 1 day, THEN 1 tablet (250 mg total) daily for 4 days. 05/29/22 06/03/22 Yes Safira Proffit, Michele Rockers, FNP  albuterol (PROAIR HFA) 108 (90 Base) MCG/ACT inhaler Inhale 2 puffs into the lungs every 4 (four) hours as needed for wheezing or shortness of  breath. 08/12/18   Charlies Silvers, MD  EPINEPHrine (EPIPEN 2-PAK) 0.3 mg/0.3 mL IJ SOAJ injection USE AS DIRECTED FOR SEVERE ALLERGIC REACTION. HOLD RX.  PT WILL CALL FOR FILL 04/19/18   Charlies Silvers, MD  fexofenadine ([name]) 180 MG tablet Take 180 mg by mouth daily as needed for allergies or rhinitis.    [provider]  fluticasone (FLONASE) 50 MCG/ACT nasal spray Place 1 spray into both nostrils 2 (two) times daily as needed (nasal congestion). 04/24/21   Garnet Sierras, DO  ibuprofen (ADVIL,MOTRIN) 400 MG tablet Take 400 mg by mouth every 6 (six) hours as needed for fever or moderate pain.     [provider]  NUVARING 0.12-0.015 MG/24HR vaginal ring INSERT 1 VAGINAL RING MONTHLY FOR 21 DAYS 04/25/17   [provider]    Family History Family History  Problem Relation Age of Onset   Allergic rhinitis Mother    Allergic rhinitis Maternal Grandmother    Breast cancer Maternal Grandmother 63   Ehlers-Danlos syndrome Brother    Angioedema Neg Hx    Asthma Neg Hx    Atopy Neg Hx    Eczema Neg Hx    Immunodeficiency Neg Hx    Urticaria Neg Hx     Social History Social History   Tobacco Use   Smoking status: Never   Smokeless tobacco: Never  Vaping Use   Vaping  Use: Never used  Substance Use Topics   Alcohol use: No    Alcohol/week: 0.0 standard drinks of alcohol   Drug use: No     Allergies   Peanut oil, Peanut-containing drug products, Penicillins, and Penicillamine   Review of Systems Review of Systems Per HPI  Physical Exam Triage Vital Signs ED Triage Vitals  Enc Vitals Group     BP 05/29/22 1632 121/72     Pulse Rate 05/29/22 1632 74     Resp 05/29/22 1632 18     Temp 05/29/22 1632 98 F (36.7 C)     Temp src --      SpO2 05/29/22 1632 98 %     Weight --      Height --      Head Circumference --      Peak Flow --      Pain Score 05/29/22 1631 0     Pain Loc --      Pain Edu? --      Excl. in [place]? --    No data  found.  Updated Vital Signs BP 121/72   Pulse 74   Temp 98 F (36.7 C)   Resp 18   SpO2 98%   Visual Acuity Right Eye Distance:   Left Eye Distance:   Bilateral Distance:    Right Eye Near:   Left Eye Near:    Bilateral Near:     Physical Exam Constitutional:      General: She is not in acute distress.    Appearance: Normal appearance. She is not toxic-appearing or diaphoretic.  HENT:     Head: Normocephalic and atraumatic.     Right Ear: Tympanic membrane and ear canal normal.     Left Ear: Tympanic membrane and ear canal normal.     Nose: Congestion present.     Right Sinus: Maxillary sinus tenderness present. No frontal sinus tenderness.     Left Sinus: Maxillary sinus tenderness present. No frontal sinus tenderness.     Mouth/Throat:     Mouth: Mucous membranes are moist.     Pharynx: No posterior oropharyngeal erythema.  Eyes:     Extraocular Movements: Extraocular movements intact.     Conjunctiva/sclera: Conjunctivae normal.     Pupils: Pupils are equal, round, and reactive to light.  Cardiovascular:     Rate and Rhythm: Normal rate and regular rhythm.     Pulses: Normal pulses.     Heart sounds: Normal heart sounds.  Pulmonary:     Effort: Pulmonary effort is normal. No respiratory distress.     Breath sounds: Normal breath sounds. No stridor. No wheezing, rhonchi or rales.  Abdominal:     General: Abdomen is flat. Bowel sounds are normal.     Palpations: Abdomen is soft.  Musculoskeletal:        General: Normal range of motion.     Cervical back: Normal range of motion.  Skin:    General: Skin is warm and dry.  Neurological:     General: No focal deficit present.     Mental Status: She is alert and oriented to person, place, and time. Mental status is at baseline.  Psychiatric:        Mood and Affect: Mood normal.        Behavior: Behavior normal.      UC Treatments / Results  Labs (all labs ordered are listed, but only abnormal results are  displayed) Labs Reviewed - No data to  display  EKG   Radiology No results found.  Procedures Procedures (including critical care time)  Medications Ordered in UC Medications - No data to display  Initial Impression / Assessment and Plan / UC Course  I have reviewed the triage vital signs and the nursing notes.  Pertinent labs & imaging results that were available during my care of the patient were reviewed by me and considered in my medical decision making (see chart for details).     Physical exam is consistent with acute sinus infection.  Discussed with patient that symptoms could be viral in nature as well.  Given physical exam and duration of symptoms, will treat with antibiotic.  Do not think viral testing is necessary given duration of symptoms and would not change treatment.  Discussed supportive care.  Discussed strict return precautions.  Patient verbalized understanding and was agreeable with plan. Final Clinical Impressions(s) / UC Diagnoses   Final diagnoses:  Other acute recurrent sinusitis     Discharge Instructions      It appears that you have a sinus infection so I am treating you with an antibiotic.  Please follow-up if symptoms persist or worsen.    ED Prescriptions     Medication Sig Dispense Auth. Provider   azithromycin (ZITHROMAX Z-PAK) 250 MG tablet Take 2 tablets (500 mg total) by mouth daily for 1 day, THEN 1 tablet (250 mg total) daily for 4 days. 6 tablet [place], Michele Rockers, [place]      PDMP not reviewed this encounter.   Teodora Medici,  05/29/22 1651

## 2022-05-29 NOTE — ED Triage Notes (Signed)
Pt is present today with c/o itchy throat and nasal congestion. Pt sx started last Thursday

## 2022-05-29 NOTE — Discharge Instructions (Signed)
It appears that you have a sinus infection so I am treating you with an antibiotic.  Please follow-up if symptoms persist or worsen.

## 2022-09-08 ENCOUNTER — Ambulatory Visit
Admission: EM | Admit: 2022-09-08 | Discharge: 2022-09-08 | Disposition: A | Discharge status: Home / Self Care | Payer: BC Managed Care – PPO

## 2022-09-08 DIAGNOSIS — R051 Acute cough: Secondary | ICD-10-CM

## 2022-09-08 DIAGNOSIS — R0981 Nasal congestion: Secondary | ICD-10-CM

## 2022-09-08 DIAGNOSIS — H9203 Otalgia, bilateral: Secondary | ICD-10-CM

## 2022-09-08 MED ORDER — FLUTICASONE PROPIONATE 50 MCG/ACT NA SUSP: 1.0000 | Freq: Two times a day (BID) | NASAL | 5 refills | Status: AC | PRN

## 2022-09-08 MED ORDER — PREDNISONE 10 MG (21) PO TBPK: ORAL_TABLET | Freq: Every day | ORAL | 0 refills | Status: DC

## 2022-09-08 NOTE — ED Provider Notes (Signed)
[name]    CSN: [id number] Arrival date & time: 09/08/22  1057      History   Chief Complaint Chief Complaint  Patient presents with   Nasal Congestion    Nasal congestion sore throat and cough onset of symptoms hitting the 7 day mark - Entered by patient    HPI Molly Pace is a 22 y.o. female.   Patient presents today with nasal congestion cough since returning back from a trip last week.  Patient denies any fevers no chest pain no shortness of breath.  Patient has been taking DayQuil with minimal relief.      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      Home Medications    Prior to Admission medications   Medication Sig Start Date End Date Taking? Authorizing Provider  JUNEL 1.5/30 1.5-30 MG-MCG tablet Take 1 tablet by mouth daily. 03/13/22  Yes [provider]  predniSONE (STERAPRED UNI-PAK 21 TAB) 10 MG (21) TBPK tablet Take by mouth daily. Take 6 tabs by mouth daily  for 2 days, then 5 tabs for 2 days, then 4 tabs for 2 days, then 3 tabs for 2 days, 2 tabs for 2 days, then 1 tab by mouth daily for 2 days 09/08/22  Yes Marney Setting, NP  albuterol (PROAIR HFA) 108 (90 Base) MCG/ACT inhaler Inhale 2  puffs into the lungs every 4 (four) hours as needed for wheezing or shortness of breath. 08/12/18   Charlies Silvers, MD  EPINEPHrine (EPIPEN 2-PAK) 0.3 mg/0.3 mL IJ SOAJ injection USE AS DIRECTED FOR SEVERE ALLERGIC REACTION. HOLD RX.  PT WILL CALL FOR FILL 04/19/18   Charlies Silvers, MD  fexofenadine ([name]) 180 MG tablet Take 180 mg by mouth daily as needed for allergies or rhinitis.    [provider]  fluticasone (FLONASE) 50 MCG/ACT nasal spray Place 1 spray into both nostrils 2 (two) times daily as needed (nasal congestion). 09/08/22   Marney Setting, NP  ibuprofen (ADVIL,MOTRIN) 400 MG tablet Take 400 mg by mouth every 6 (six) hours as needed for fever or moderate pain.     [provider]  NUVARING 0.12-0.015 MG/24HR vaginal ring INSERT 1 VAGINAL RING MONTHLY FOR 21 DAYS 04/25/17   [provider]    Family History Family History  Problem Relation Age of Onset   Allergic rhinitis Mother    Allergic rhinitis Maternal Grandmother    Breast cancer Maternal Grandmother 29   Ehlers-Danlos syndrome Brother    Angioedema Neg Hx    Asthma Neg Hx    Atopy Neg Hx    Eczema Neg Hx    Immunodeficiency  Neg Hx    Urticaria Neg Hx     Social History Social History   Tobacco Use   Smoking status: Never   Smokeless tobacco: Never  Vaping Use   Vaping Use: Never used  Substance Use Topics   Alcohol use: No    Alcohol/week: 0.0 standard drinks of alcohol   Drug use: No     Allergies   Peanut oil, Peanut-containing drug products, Penicillins, and Penicillamine   Review of Systems Review of Systems  Constitutional:  Negative for chills and fever.  HENT:  Positive for congestion, ear pain, postnasal drip, rhinorrhea, sinus pressure, sinus pain, sneezing and sore throat.   Eyes: Negative.   Respiratory:  Positive for cough.   Cardiovascular: Negative.   Gastrointestinal: Negative.   Genitourinary: Negative.   Neurological: Negative.       Physical Exam Triage Vital Signs ED Triage Vitals [09/08/22 1121]  Enc Vitals Group     BP 124/81     Pulse Rate 89     Resp 16     Temp 97.9 F (36.6 C)     Temp Source Oral     SpO2 99 %     Weight      Height      Head Circumference      Peak Flow      Pain Score 0     Pain Loc      Pain Edu?      Excl. in [place]?    No data found.  Updated Vital Signs BP 124/81 (BP Location: Left Arm)   Pulse 89   Temp 97.9 F (36.6 C) (Oral)   Resp 16   SpO2 99%   Visual Acuity Right Eye Distance:   Left Eye Distance:   Bilateral Distance:    Right Eye Near:   Left Eye Near:    Bilateral Near:     Physical Exam Constitutional:      Appearance: Normal appearance.  HENT:     Right Ear: Tympanic membrane normal.     Left Ear: Tympanic membrane normal.     Nose: Congestion and rhinorrhea present.     Mouth/Throat:     Mouth: Mucous membranes are moist.     Pharynx: Posterior oropharyngeal erythema present.  Eyes:     Pupils: Pupils are equal, round, and reactive to light.  Cardiovascular:     Rate and Rhythm: Normal rate.     Pulses: Normal pulses.  Pulmonary:     Effort: Pulmonary effort is normal.  Abdominal:     General: Abdomen is flat.  Musculoskeletal:        General: Normal range of motion.     Cervical back: Normal range of motion.  Skin:    General: Skin is warm.  Neurological:     General: No focal deficit present.     Mental Status: She is alert.      UC Treatments / Results  Labs (all labs ordered are listed, but only abnormal results are displayed) Labs Reviewed - No data to display  EKG   Radiology No results found.  Procedures Procedures (including critical care time)  Medications Ordered in UC Medications - No data to display  Initial Impression / Assessment and Plan / UC Course  I have reviewed the triage vital signs and the nursing notes.  Pertinent labs & imaging results that were available during my care of the patient  were reviewed by me and considered in my medical decision  making (see chart for details).     Patient's symptoms are more viral in nature Use a humidifier as needed Take Tylenol or Motrin as needed for fever discomfort Take over-the-counter cough and cold medications Stay hydrated drink plenty of fluids Final Clinical Impressions(s) / UC Diagnoses   Final diagnoses:  Nasal congestion  Otalgia of both ears  Acute cough   Discharge Instructions   None    ED Prescriptions     Medication Sig Dispense Auth. Provider   fluticasone (FLONASE) 50 MCG/ACT nasal spray Place 1 spray into both nostrils 2 (two) times daily as needed (nasal congestion). 16 g Morley Kos L, NP   predniSONE (STERAPRED UNI-PAK 21 TAB) 10 MG (21) TBPK tablet Take by mouth daily. Take 6 tabs by mouth daily  for 2 days, then 5 tabs for 2 days, then 4 tabs for 2 days, then 3 tabs for 2 days, 2 tabs for 2 days, then 1 tab by mouth daily for 2 days 42 tablet Marney Setting, NP      PDMP not reviewed this encounter.   Marney Setting, NP 09/08/22 1213

## 2022-09-08 NOTE — ED Triage Notes (Signed)
Pt c/o nasal drainage, coughing, sneezing  Denies cough, sore throat, headache, body aches  Onset ~ last week once returning from [place] trip.

## 2022-10-20 ENCOUNTER — Encounter: Payer: Self-pay | Admitting: Emergency Medicine

## 2022-10-20 ENCOUNTER — Ambulatory Visit
Admission: EM | Admit: 2022-10-20 | Discharge: 2022-10-20 | Disposition: A | Discharge status: Home / Self Care | Payer: BC Managed Care – PPO | Attending: Physician Assistant | Admitting: Physician Assistant

## 2022-10-20 DIAGNOSIS — R0781 Pleurodynia: Secondary | ICD-10-CM | POA: Diagnosis not present

## 2022-10-20 MED ORDER — PREDNISONE 20 MG PO TABS: 40.0000 mg | ORAL_TABLET | Freq: Every day | ORAL | 0 refills | Status: AC

## 2022-10-20 NOTE — ED Provider Notes (Signed)
[name]    CSN: [id number] Arrival date & time: 10/20/22  1433      History   Chief Complaint Chief Complaint  Patient presents with   Back Pain    left    HPI Molly Pace is a 22 y.o. female.   Patient here today for evaluation of left sided lower rib pain that started about a week ago. She notes symptoms started after she had significant coughing and sneezing after asthma flare. She has not had any fever. She denies any vomiting. Movement and deep breathing worsen pain. She has tried methocarbamol with temporary improvement.   The history is provided by the patient.    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      Home Medications    Prior to Admission medications   Medication Sig Start Date End Date Taking? Authorizing Provider  predniSONE (DELTASONE) 20 MG tablet Take 2 tablets (40 mg total) by mouth daily with breakfast for 5 days. 10/20/22 10/25/22 Yes Francene Finders, PA-C  albuterol [facility] HFA) 108 (90 Base) MCG/ACT inhaler Inhale 2 puffs into the lungs every 4 (four) hours as needed for wheezing or shortness of breath. 08/12/18   Charlies Silvers, MD  EPINEPHrine (EPIPEN 2-PAK) 0.3 mg/0.3 mL IJ SOAJ  injection USE AS DIRECTED FOR SEVERE ALLERGIC REACTION. HOLD RX.  PT WILL CALL FOR FILL 04/19/18   Charlies Silvers, MD  fexofenadine ([name]) 180 MG tablet Take 180 mg by mouth daily as needed for allergies or rhinitis.    [provider]  fluticasone (FLONASE) 50 MCG/ACT nasal spray Place 1 spray into both nostrils 2 (two) times daily as needed (nasal congestion). 09/08/22   Marney Setting, NP  ibuprofen (ADVIL,MOTRIN) 400 MG tablet Take 400 mg by mouth every 6 (six) hours as needed for fever or moderate pain.     [provider]  JUNEL 1.5/30 1.5-30 MG-MCG tablet Take 1 tablet by mouth daily. 03/13/22   [provider]  NUVARING 0.12-0.015 MG/24HR vaginal ring INSERT 1 VAGINAL RING MONTHLY FOR 21 DAYS 04/25/17   [provider]    Family History Family History  Problem Relation Age of Onset   Allergic rhinitis Mother    Allergic rhinitis Maternal Grandmother    Breast cancer Maternal Grandmother 21   Ehlers-Danlos syndrome Brother    Angioedema Neg Hx    Asthma Neg Hx    Atopy Neg Hx    Eczema Neg Hx    Immunodeficiency Neg Hx    Urticaria Neg Hx     Social History Social History   Tobacco Use   Smoking status: Never   Smokeless tobacco:  Never  Vaping Use   Vaping Use: Never used  Substance Use Topics   Alcohol use: No    Alcohol/week: 0.0 standard drinks of alcohol   Drug use: No     Allergies   Peanut oil, Peanut-containing drug products, Penicillins, and Penicillamine   Review of Systems Review of Systems  Constitutional:  Negative for chills and fever.  Eyes:  Negative for discharge and redness.  Respiratory:  Positive for cough. Negative for shortness of breath.   Cardiovascular:  Positive for chest pain.  Gastrointestinal:  Negative for abdominal pain, nausea and vomiting.     Physical Exam Triage Vital Signs ED Triage Vitals  Enc Vitals Group     BP      Pulse      Resp      Temp      Temp src      SpO2       Weight      Height      Head Circumference      Peak Flow      Pain Score      Pain Loc      Pain Edu?      Excl. in [place]?    No data found.  Updated Vital Signs BP 100/67 (BP Location: Left Arm)   Pulse (!) 52   Temp 97.9 F (36.6 C) (Oral)   Resp 16   LMP 10/04/2022   SpO2 100%      Physical Exam Vitals and nursing note reviewed.  Constitutional:      General: She is not in acute distress.    Appearance: Normal appearance. She is not ill-appearing.  HENT:     Head: Normocephalic and atraumatic.  Eyes:     Conjunctiva/sclera: Conjunctivae normal.  Cardiovascular:     Rate and Rhythm: Normal rate and regular rhythm.  Pulmonary:     Effort: Pulmonary effort is normal. No respiratory distress.     Breath sounds: Normal breath sounds. No wheezing, rhonchi or rales.  Chest:     Chest wall: Tenderness (left lower rib) present.  Neurological:     Mental Status: She is alert.  Psychiatric:        Mood and Affect: Mood normal.        Behavior: Behavior normal.        Thought Content: Thought content normal.      UC Treatments / Results  Labs (all labs ordered are listed, but only abnormal results are displayed) Labs Reviewed - No data to display  EKG   Radiology No results found.  Procedures Procedures (including critical care time)  Medications Ordered in UC Medications - No data to display  Initial Impression / Assessment and Plan / UC Course  I have reviewed the triage vital signs and the nursing notes.  Pertinent labs & imaging results that were available during my care of the patient were reviewed by me and considered in my medical decision making (see chart for details).    Suspect likely muscular strain. Recommend continued muscle relaxer and steroid burst for treatment. Discussed that muscle relaxer may cause drowsiness and to use with caution. Encouraged follow up if no gradual improvement or with any further concerns.   Final Clinical  Impressions(s) / UC Diagnoses   Final diagnoses:  Rib pain on left side   Discharge Instructions   None    ED Prescriptions     Medication Sig Dispense Auth. Provider   predniSONE (DELTASONE) 20 MG  tablet Take 2 tablets (40 mg total) by mouth daily with breakfast for 5 days. 10 tablet Francene Finders, PA-C      PDMP not reviewed this encounter.   Francene Finders, PA-C 10/20/22 1649

## 2022-10-20 NOTE — ED Triage Notes (Signed)
Pt presents with left side/ back pain Xs 1 week. States started after asthma flare up with coughing and sneezing.

## 2022-10-21 ENCOUNTER — Ambulatory Visit: Payer: Self-pay

## 2023-07-21 ENCOUNTER — Ambulatory Visit
Admission: RE | Admit: 2023-07-21 | Discharge: 2023-07-21 | Disposition: A | Discharge status: Home / Self Care | Payer: BC Managed Care – PPO | Source: Ambulatory Visit | Attending: Physician Assistant | Admitting: Physician Assistant

## 2023-07-21 ENCOUNTER — Other Ambulatory Visit: Payer: Self-pay

## 2023-07-21 VITALS — BP 125/82 | HR 71 | Temp 97.7°F | Resp 16

## 2023-07-21 DIAGNOSIS — N390 Urinary tract infection, site not specified: Secondary | ICD-10-CM | POA: Insufficient documentation

## 2023-07-21 LAB — POCT URINALYSIS DIP (MANUAL ENTRY)
Bilirubin, UA: NEGATIVE
Glucose, UA: NEGATIVE mg/dL
Ketones, POC UA: NEGATIVE mg/dL
Nitrite, UA: POSITIVE — AB
Protein Ur, POC: 100 mg/dL — AB
Spec Grav, UA: >=1.03 — AB (ref 1.010–1.025)
Urobilinogen, UA: 0.2 U/dL
pH, UA: 6 (ref 5.0–8.0)

## 2023-07-21 MED ORDER — NITROFURANTOIN MONOHYD MACRO 100 MG PO CAPS: 100.0000 mg | ORAL_CAPSULE | Freq: Two times a day (BID) | ORAL | 0 refills | Status: AC

## 2023-07-21 NOTE — ED Provider Notes (Signed)
[name]    CSN: [id number] Arrival date & time: 07/21/23  1407      History   Chief Complaint Chief Complaint  Patient presents with   Urinary Frequency    I have pain and frequency urinating. - Entered by patient   Dysuria    HPI Molly Pace is a 22 y.o. female.   Patient here today for evaluation of suprapubic pain, dysuria and urinary frequency that started 3 to 4 days ago.  She denies any fever.  She has not had any abdominal pain otherwise, nausea, vomiting or back pain.  She has tried drinking more water and cranberry juice without resolution.  The history is provided by the patient.  Urinary Frequency Pertinent negatives include no chest pain, no abdominal pain and no shortness of breath.  Dysuria Associated symptoms: no abdominal pain, no fever, no nausea and no vomiting     Past Medical History:  Diagnosis Date   Allergic rhinitis    Asthma    Food allergy     Patient Active Problem List   Diagnosis Date Noted   Fatigue 05/15/2021   Nausea without vomiting 05/15/2021   Other allergic rhinitis 04/24/2021   Allergic conjunctivitis of both eyes 04/24/2021   Penicillin allergy 04/24/2021   Frequent sinus infections 04/24/2021   Ganglion cyst of dorsum of right wrist 01/28/2018   Seasonal allergies 03/12/2017   Dysmenorrhea 03/12/2017   Asthma 03/12/2017   Irregular periods 03/12/2017   Mild intermittent asthma 10/01/2015   Anaphylactic reaction due to other food products, 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      Home Medications    Prior to Admission medications   Medication Sig Start Date End Date Taking? Authorizing Provider  albuterol (PROAIR HFA) 108 (90 Base) MCG/ACT inhaler  Inhale 2 puffs into the lungs every 4 (four) hours as needed for wheezing or shortness of breath. 08/12/18  Yes Bardelas, Bonnita Hollow, MD  betamethasone acetate-betamethasone sodium phosphate (CELESTONE) 6 (3-3) MG/ML injection Inject into the articular space. 05/14/21  Yes [provider]  EPINEPHrine (EPIPEN 2-PAK) 0.3 mg/0.3 mL IJ SOAJ injection USE AS DIRECTED FOR SEVERE ALLERGIC REACTION. HOLD RX.  PT WILL CALL FOR FILL 04/19/18  Yes Bardelas, Jose A, MD  fexofenadine ([name]) 180 MG tablet Take 180 mg by mouth daily as needed for allergies or rhinitis.   Yes [provider]  fluticasone (FLONASE) 50 MCG/ACT nasal spray Place 1 spray into both nostrils 2 (two) times daily as needed (nasal congestion). 09/08/22  Yes Coralyn Mark, NP  JUNEL 1.5/30 1.5-30 MG-MCG tablet Take 1 tablet by mouth daily. 03/13/22  Yes [provider]  montelukast (SINGULAIR) 10 MG tablet Take 10 mg by mouth at bedtime. 05/12/17  Yes [provider]  Montelukast Sodium (SINGULAIR PO) Singulair   Yes [provider]  nitrofurantoin, macrocrystal-monohydrate, (MACROBID) 100 MG capsule Take 1 capsule (100 mg total) by mouth 2 (two) times daily. 07/21/23  Yes Tomi Bamberger, PA-C  lidocaine (XYLOCAINE) 1 % (with preservative) injection by Infiltration route. 05/14/21   [provider]  norethindrone (AYGESTIN) 5 MG tablet Take 5 mg by mouth daily.    [provider]  Family History Family History  Problem Relation Age of Onset   Allergic rhinitis Mother    Ehlers-Danlos syndrome Brother    Allergic rhinitis Maternal Grandmother    Breast cancer Maternal Grandmother 69   Angioedema Neg Hx    Asthma Neg Hx    Atopy Neg Hx    Eczema Neg Hx    Immunodeficiency Neg Hx    Urticaria Neg Hx     Social History Social History   Tobacco Use   Smoking status: Never   Smokeless tobacco: Never  Vaping Use   Vaping status: Never Used  Substance Use Topics    Alcohol use: No    Alcohol/week: 0.0 standard drinks of alcohol   Drug use: No     Allergies   Peanut oil, Peanut-containing drug products, Penicillin g, Penicillins, and Penicillamine   Review of Systems Review of Systems  Constitutional:  Negative for chills and fever.  Eyes:  Negative for discharge and redness.  Respiratory:  Negative for shortness of breath.   Cardiovascular:  Negative for chest pain.  Gastrointestinal:  Negative for abdominal pain, nausea and vomiting.  Genitourinary:  Positive for dysuria and frequency.  Musculoskeletal:  Negative for back pain.     Physical Exam Triage Vital Signs ED Triage Vitals  Encounter Vitals Group     BP 07/21/23 1430 125/82     Systolic BP Percentile --      Diastolic BP Percentile --      Pulse Rate 07/21/23 1430 71     Resp 07/21/23 1430 16     Temp 07/21/23 1430 97.7 F (36.5 C)     Temp Source 07/21/23 1430 Oral     SpO2 07/21/23 1430 98 %     Weight --      Height --      Head Circumference --      Peak Flow --      Pain Score 07/21/23 1426 3     Pain Loc --      Pain Education --      Exclude from Growth Chart --    No data found.  Updated Vital Signs BP 125/82 (BP Location: Right Arm)   Pulse 71   Temp 97.7 F (36.5 C) (Oral)   Resp 16   LMP 07/10/2023   SpO2 98%     Physical Exam Vitals and nursing note reviewed.  Constitutional:      General: She is not in acute distress.    Appearance: Normal appearance. She is not ill-appearing.  HENT:     Head: Normocephalic and atraumatic.  Eyes:     Conjunctiva/sclera: Conjunctivae normal.  Cardiovascular:     Rate and Rhythm: Normal rate.  Pulmonary:     Effort: Pulmonary effort is normal. No respiratory distress.  Neurological:     Mental Status: She is alert.  Psychiatric:        Mood and Affect: Mood normal.        Behavior: Behavior normal.        Thought Content: Thought content normal.      UC Treatments / Results  Labs (all labs  ordered are listed, but only abnormal results are displayed) Labs Reviewed  POCT URINALYSIS DIP (MANUAL ENTRY) - Abnormal; Notable for the following components:      Result Value   Clarity, UA cloudy (*)    Spec Grav, UA >=1.030 (*)    Blood, UA moderate (*)    Protein Ur, POC =100 (*)  Nitrite, UA Positive (*)    Leukocytes, UA Small (1+) (*)    All other components within normal limits  URINE CULTURE    EKG   Radiology No results found.  Procedures Procedures (including critical care time)  Medications Ordered in UC Medications - No data to display  Initial Impression / Assessment and Plan / UC Course  I have reviewed the triage vital signs and the nursing notes.  Pertinent labs & imaging results that were available during my care of the patient were reviewed by me and considered in my medical decision making (see chart for details).    Macrobid prescribed to cover UTI.  Urine culture ordered.  Recommended follow-up if no gradual improvement with any further concerns.  Final Clinical Impressions(s) / UC Diagnoses   Final diagnoses:  Urinary tract infection without hematuria, site unspecified   Discharge Instructions   None    ED Prescriptions     Medication Sig Dispense Auth. Provider   nitrofurantoin, macrocrystal-monohydrate, (MACROBID) 100 MG capsule Take 1 capsule (100 mg total) by mouth 2 (two) times daily. 10 capsule Tomi Bamberger, PA-C      PDMP not reviewed this encounter.   Tomi Bamberger, PA-C 07/21/23 [phone]

## 2023-07-21 NOTE — ED Triage Notes (Signed)
Bladder pain, dysuria, and frequency x 3-4 days

## 2023-07-24 LAB — URINE CULTURE: Culture: >=100000 — AB
